# Patient Record
Sex: Female | Born: 2000 | Race: Black or African American | Hispanic: No | Marital: Single | Smoking: Current every day smoker
Health system: Southern US, Community
[De-identification: ages and names within clinical notes are randomized; demographics above are authoritative.]

## PROBLEM LIST (undated history)

## (undated) ENCOUNTER — Inpatient Hospital Stay (HOSPITAL_COMMUNITY): Payer: Self-pay

## (undated) ENCOUNTER — Ambulatory Visit (HOSPITAL_COMMUNITY): Payer: MEDICAID | Source: Home / Self Care

## (undated) DIAGNOSIS — Z789 Other specified health status: Secondary | ICD-10-CM

## (undated) DIAGNOSIS — Z3490 Encounter for supervision of normal pregnancy, unspecified, unspecified trimester: Secondary | ICD-10-CM

## (undated) DIAGNOSIS — D573 Sickle-cell trait: Secondary | ICD-10-CM

## (undated) DIAGNOSIS — L0291 Cutaneous abscess, unspecified: Secondary | ICD-10-CM

## (undated) DIAGNOSIS — L0591 Pilonidal cyst without abscess: Secondary | ICD-10-CM

## (undated) DIAGNOSIS — D649 Anemia, unspecified: Secondary | ICD-10-CM

## (undated) HISTORY — PX: NO PAST SURGERIES: SHX2092

---
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
## 2022-01-12 ENCOUNTER — Ambulatory Visit (HOSPITAL_COMMUNITY)
Admission: EM | Admit: 2022-01-12 | Discharge: 2022-01-12 | Disposition: A | Discharge status: Home / Self Care | Payer: Medicaid Other

## 2022-01-12 NOTE — ED Notes (Signed)
Patient was called in lobby and patient access called patient .  Patient answerred phone and was told room ready, but hung up ?

## 2022-01-12 NOTE — ED Notes (Signed)
Called patient letting her know room is ready. Pt stated she was on the way to [facility].  ?

## 2022-04-05 ENCOUNTER — Telehealth (HOSPITAL_COMMUNITY): Payer: Self-pay | Admitting: Physician Assistant

## 2022-04-05 ENCOUNTER — Encounter (HOSPITAL_COMMUNITY): Payer: Self-pay

## 2022-04-05 ENCOUNTER — Ambulatory Visit (HOSPITAL_COMMUNITY)
Admission: EM | Admit: 2022-04-05 | Discharge: 2022-04-05 | Disposition: A | Discharge status: Home / Self Care | Payer: Medicaid Other | Attending: Physician Assistant | Admitting: Physician Assistant

## 2022-04-05 DIAGNOSIS — R051 Acute cough: Secondary | ICD-10-CM

## 2022-04-05 DIAGNOSIS — J069 Acute upper respiratory infection, unspecified: Secondary | ICD-10-CM | POA: Diagnosis not present

## 2022-04-05 LAB — POCT RAPID STREP A, ED / UC: Streptococcus, Group A Screen (Direct): NEGATIVE

## 2022-04-05 MED ORDER — PROMETHAZINE-DM 6.25-15 MG/5ML PO SYRP: 5.0000 mL | ORAL_SOLUTION | Freq: Four times a day (QID) | ORAL | 0 refills | Status: DC | PRN

## 2022-04-05 NOTE — ED Triage Notes (Signed)
Patient has had cough and fatigue for 4 days. Patient also having nasal congestion. Productive cough with dark brown and sometimes yellow mucus.   Onset of emesis last night. States it is random, states she had a coughing fit that led to her throwing up.   Patient was around someone sick but that person did not get diagnosed with anything.   Patient last took OTC dayquil yesterday.

## 2022-04-05 NOTE — Discharge Instructions (Signed)
Use cough syrup as needed for cough Recommend Flonase and Mucinex Drink plenty of fluids, rest.  Return if you develop worsening symptoms.

## 2022-04-05 NOTE — ED Provider Notes (Signed)
[facility]    CSN: [id number] Arrival date & time: 04/05/22  1457      History   Chief Complaint Chief Complaint  Patient presents with   Cough   Emesis    HPI Haley [name] is a 21 y.o. female.   Pt complains of cough, fatigue, and congestion that started about four days ago.  Denies fever, chills, abdominal pain, shortness of breath, wheezing.  Denies h/o asthma.  She reports postnasal drip and sore throat.  Pt experienced vomiting yesterday after an episode of coughing.  Denies nausea.  She has taken Dayquil with temporary relief.  Denies sick contacts.     Past Medical History:  Diagnosis Date   Anemia    History of chlamydia 08/16/2016   Azithromycin Rx given 11/13   Pilonidal cyst    w/ hx recurrent abscess's   Sickle cell trait [facility])     Patient Active Problem List   Diagnosis Date Noted   Encounter for counseling regarding contraception 12/10/2020   Sickle cell trait (HCC) 06/25/2016    Past Surgical History:  Procedure Laterality Date   EXCISION MASS NECK  2010   per pt benign   PILONIDAL CYST EXCISION N/A 06/03/2021   Procedure: TREPHINATION  OF PILONIDAL CYST;  Surgeon: Berna Bue, MD;  Location: [facility];  Service: General;  Laterality: N/A;    OB History     Gravida  1   Para  1   Term  1   Preterm      AB      Living  1      SAB      IAB      Ectopic      Multiple  0   Live Births  1            Home Medications    Prior to Admission medications   Medication Sig Start Date End Date Taking? Authorizing Provider  levonorgestrel (MIRENA) 20 MCG/DAY IUD 1 each by Intrauterine route once.   Yes [provider]  promethazine-dextromethorphan (PROMETHAZINE-DM) 6.25-15 MG/5ML syrup Take 5 mLs by mouth 4 (four) times daily as needed for cough. 04/05/22  Yes Ward, Tylene Fantasia, PA-C  amoxicillin-clavulanate (AUGMENTIN) 875-125 MG tablet Take 1 tablet by mouth every 12 (twelve) hours.  10/29/21   Merrilee Jansky, MD  ibuprofen (ADVIL) 600 MG tablet Take 1 tablet (600 mg total) by mouth every 6 (six) hours as needed. 10/29/21   Lamptey, Britta Mccreedy, MD    Family History Family History  Problem Relation Age of Onset   Healthy Mother     Social History Social History   Tobacco Use   Smoking status: Every Day    Types: Cigars   Smokeless tobacco: Never  Vaping Use   Vaping Use: Every day   Devices: elfbar  Substance Use Topics   Alcohol use: No   Drug use: No     Allergies   Patient has no known allergies.   Review of Systems Review of Systems  Constitutional:  Positive for fatigue. Negative for chills and fever.  HENT:  Positive for congestion, postnasal drip and sore throat. Negative for ear pain.   Eyes:  Negative for pain and visual disturbance.  Respiratory:  Positive for cough. Negative for shortness of breath.   Cardiovascular:  Negative for chest pain and palpitations.  Gastrointestinal:  Positive for vomiting. Negative for abdominal pain, constipation, diarrhea and nausea.  Genitourinary:  Negative for  dysuria and hematuria.  Musculoskeletal:  Negative for arthralgias and back pain.  Skin:  Negative for color change and rash.  Neurological:  Negative for seizures and syncope.  All other systems reviewed and are negative.    Physical Exam Triage Vital Signs ED Triage Vitals  Enc Vitals Group     BP 04/05/22 1642 111/74     Pulse Rate 04/05/22 1642 (!) 101     Resp 04/05/22 1642 16     Temp 04/05/22 1642 98.7 F (37.1 C)     Temp Source 04/05/22 1642 Oral     SpO2 04/05/22 1642 95 %     Weight --      Height 04/05/22 1644 5\' 3"  (1.6 m)     Head Circumference --      Peak Flow --      Pain Score 04/05/22 1643 7     Pain Loc --      Pain Edu? --      Excl. in [place]? --    No data found.  Updated Vital Signs BP 111/74 (BP Location: Right Arm)   Pulse (!) 101   Temp 98.7 F (37.1 C) (Oral)   Resp 16   Ht 5\' 3"  (1.6 m)   LMP  (LMP  Unknown)   SpO2 95%   BMI 37.38 kg/m   Visual Acuity Right Eye Distance:   Left Eye Distance:   Bilateral Distance:    Right Eye Near:   Left Eye Near:    Bilateral Near:     Physical Exam Vitals and nursing note reviewed.  Constitutional:      General: She is not in acute distress.    Appearance: She is well-developed.  HENT:     Head: Normocephalic and atraumatic.  Eyes:     Conjunctiva/sclera: Conjunctivae normal.  Cardiovascular:     Rate and Rhythm: Normal rate and regular rhythm.     Heart sounds: No murmur heard. Pulmonary:     Effort: Pulmonary effort is normal. No respiratory distress.     Breath sounds: Normal breath sounds.  Abdominal:     Palpations: Abdomen is soft.     Tenderness: There is no abdominal tenderness.  Musculoskeletal:        General: No swelling.     Cervical back: Neck supple.  Skin:    General: Skin is warm and dry.     Capillary Refill: Capillary refill takes less than 2 seconds.  Neurological:     Mental Status: She is alert.  Psychiatric:        Mood and Affect: Mood normal.      UC Treatments / Results  Labs (all labs ordered are listed, but only abnormal results are displayed) Labs Reviewed  POCT RAPID STREP A, ED / UC    EKG   Radiology No results found.  Procedures Procedures (including critical care time)  Medications Ordered in UC Medications - No data to display  Initial Impression / Assessment and Plan / UC Course  I have reviewed the triage vital signs and the nursing notes.  Pertinent labs & imaging results that were available during my care of the patient were reviewed by me and considered in my medical decision making (see chart for details).     Viral URI. Pt overall well appearing in no acute distress, vitals normal, lungs clear.  Advised supportive care.  Described posttussive emesis, no nausea, abdominal pain, diarrhea.  Return precautions discussed.   Final Clinical Impressions(s) /  UC Diagnoses    Final diagnoses:  Viral upper respiratory tract infection  Acute cough     Discharge Instructions      Use cough syrup as needed for cough Recommend Flonase and Mucinex Drink plenty of fluids, rest.  Return if you develop worsening symptoms.    ED Prescriptions     Medication Sig Dispense Auth. Provider   promethazine-dextromethorphan (PROMETHAZINE-DM) 6.25-15 MG/5ML syrup Take 5 mLs by mouth 4 (four) times daily as needed for cough. 118 mL Ward, Tylene Fantasia, PA-C      PDMP not reviewed this encounter.   Ward, Tylene Fantasia, PA-C 04/05/22 [phone]

## 2022-04-05 NOTE — Telephone Encounter (Signed)
Pt called and asked for prescription to be sent to a different pharmacy.

## 2022-06-04 ENCOUNTER — Ambulatory Visit (HOSPITAL_COMMUNITY)
Admission: EM | Admit: 2022-06-04 | Discharge: 2022-06-04 | Discharge status: Against Medical Advice | Payer: Medicaid Other

## 2022-06-04 NOTE — ED Notes (Signed)
Pt was called to be placed in a room, when called the second time Pt stated they went home to get food. Educated on check in process and advised they would have to check back in to be seen. Verbalized understanding.

## 2022-07-08 IMAGING — DX DG CHEST 2V
2 series · 2 of 2 positions shown · non-contrast
Comparison: None.

CLINICAL DATA: cough

EXAM:
CHEST - 2 VIEW

[chest pa]
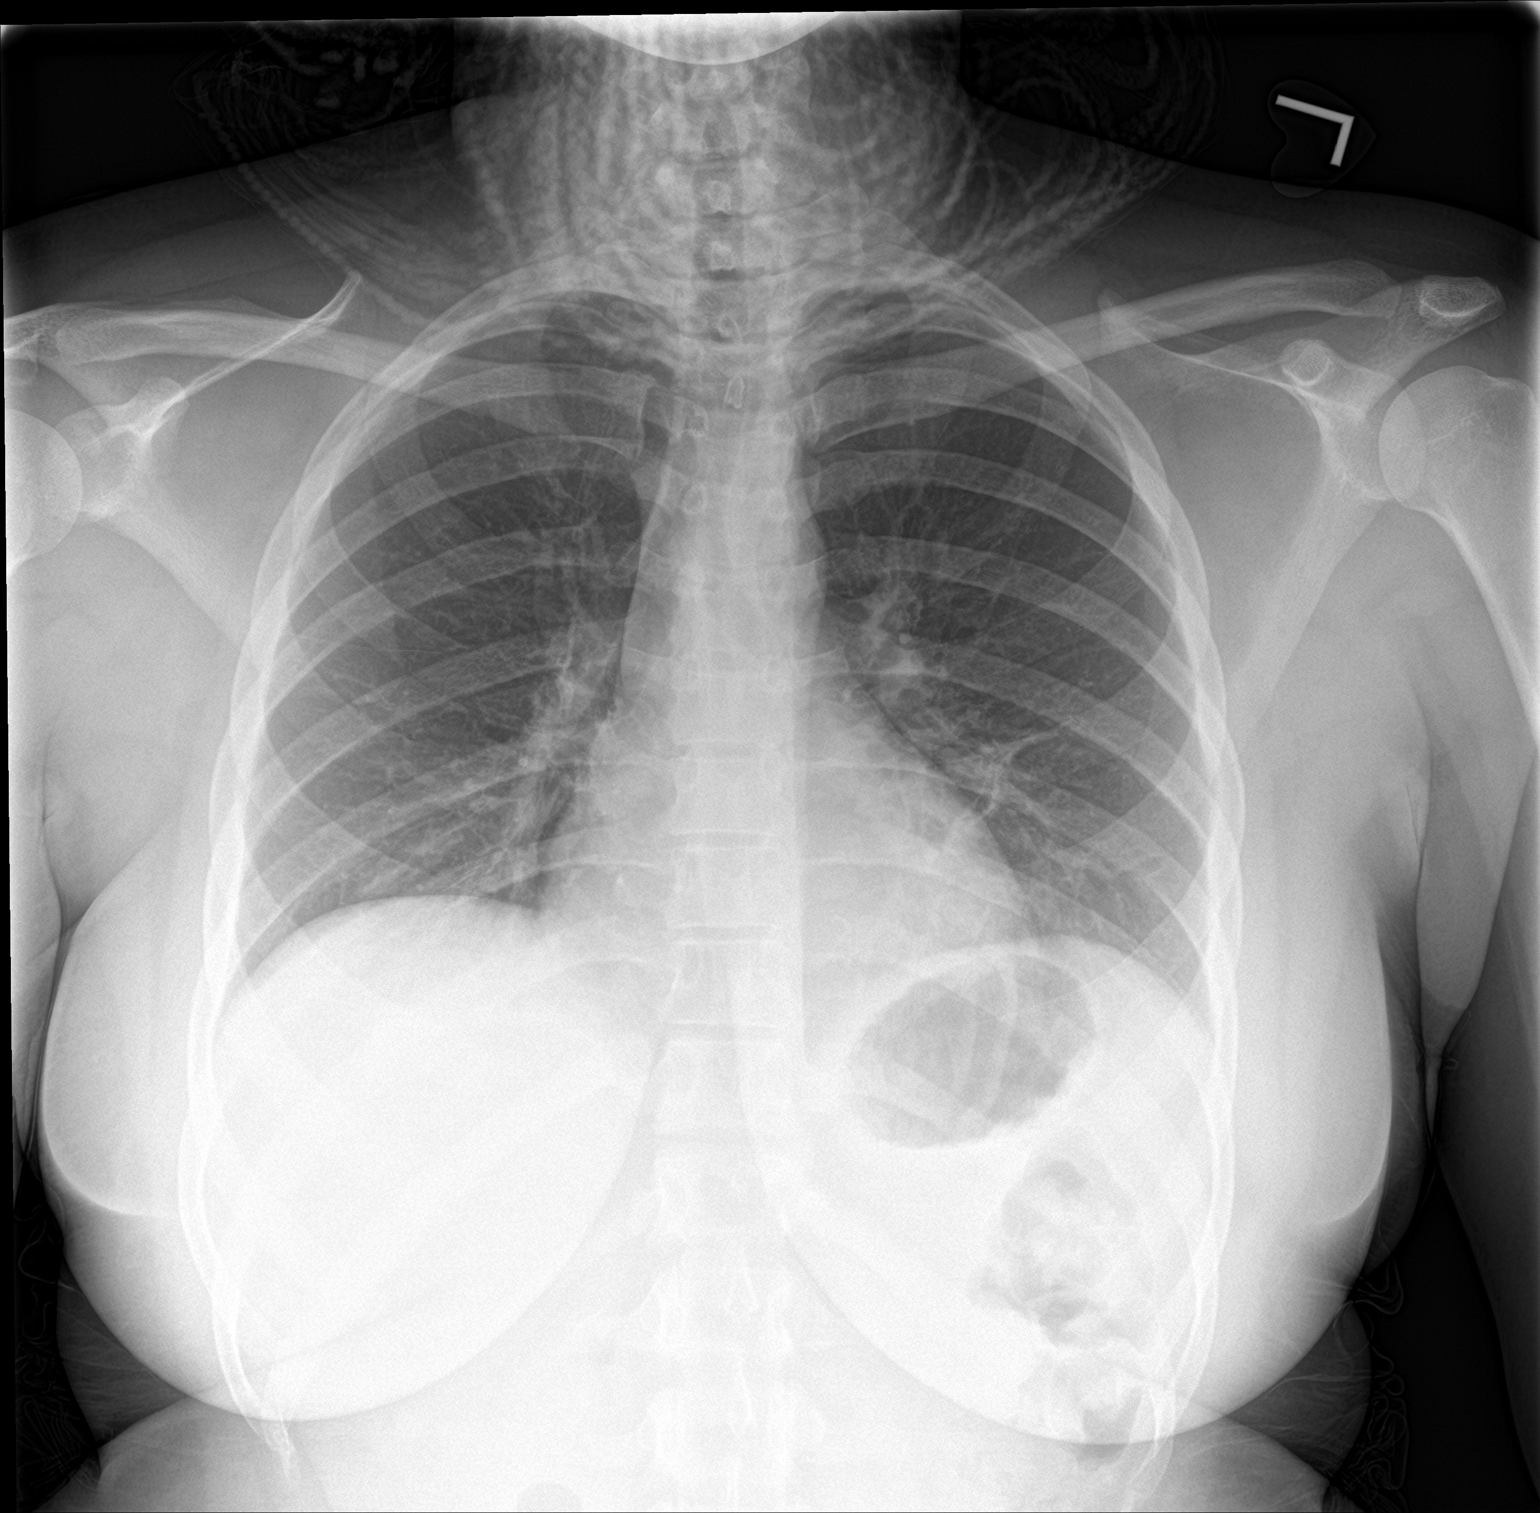

[chest lat]
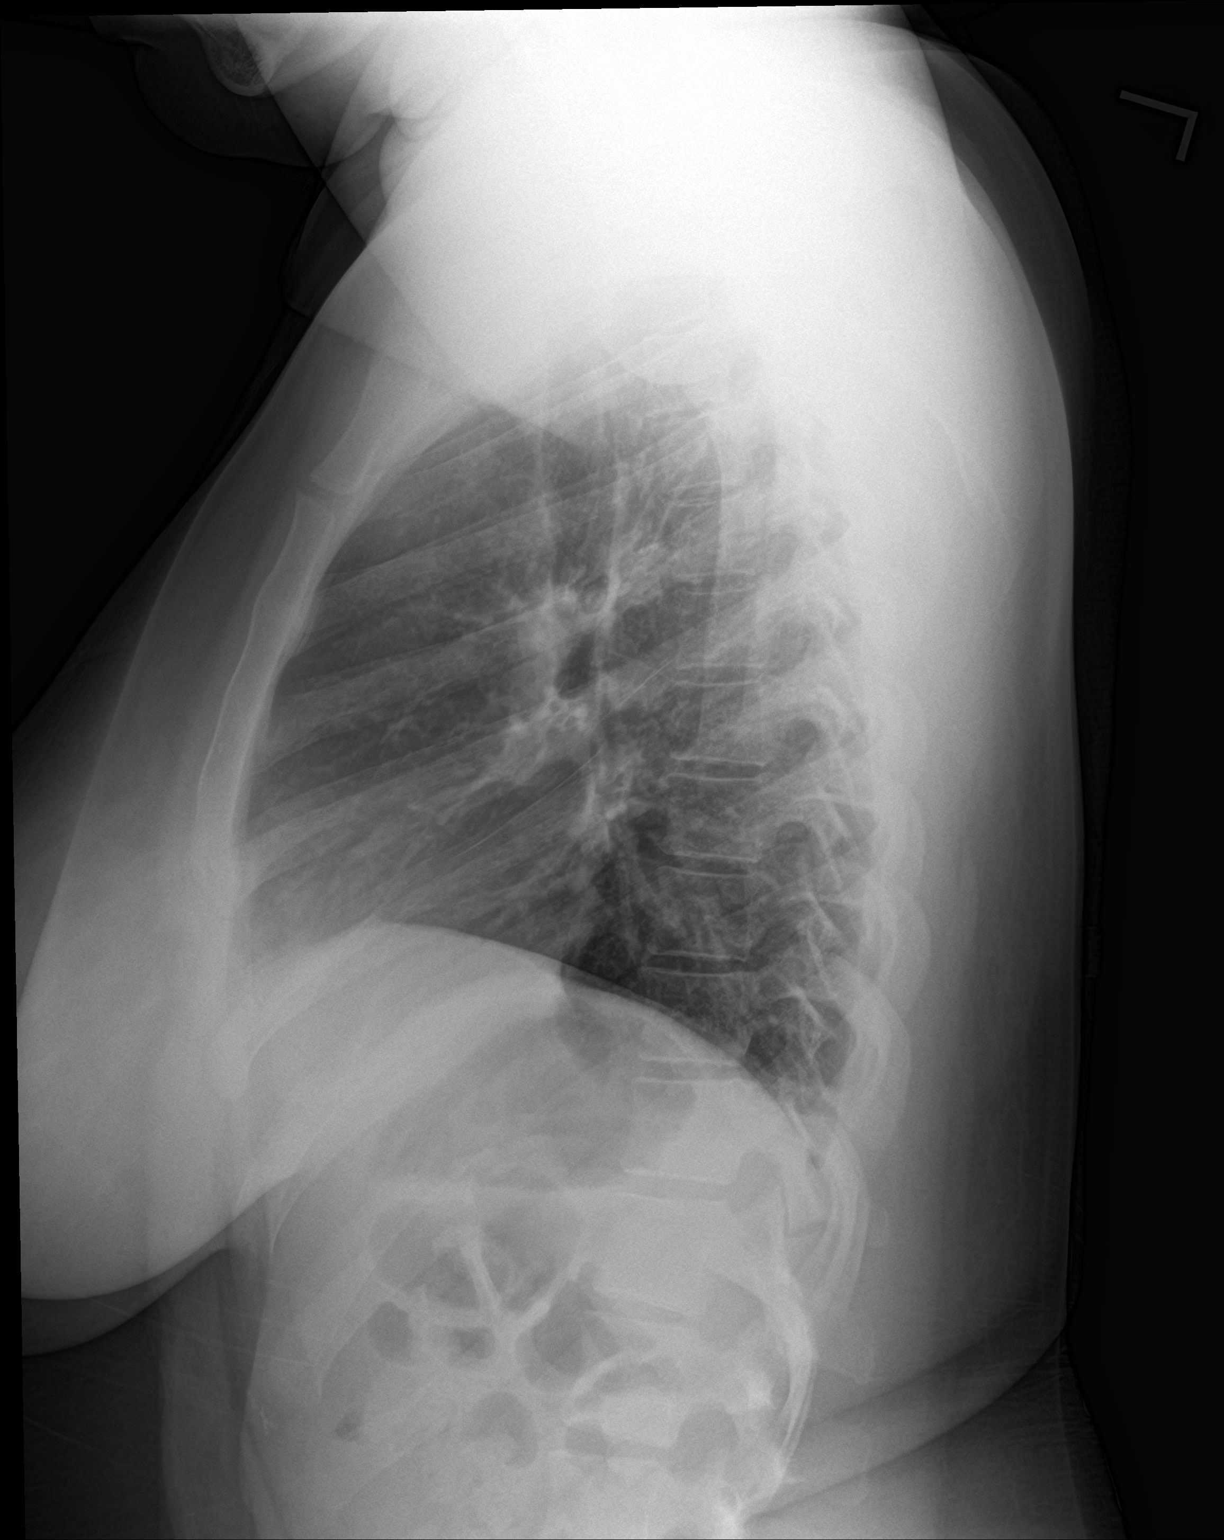

[2 of 2 positions shown; findings below may reference images not displayed]

FINDINGS: The cardiomediastinal silhouette is within normal limits. No pleural
effusion. No pneumothorax. No mass or consolidation. No acute
osseous abnormality.
IMPRESSION: No acute abnormality in the chest.  No consolidative pneumonia.

## 2022-10-07 ENCOUNTER — Emergency Department (HOSPITAL_COMMUNITY)
Admission: EM | Admit: 2022-10-07 | Discharge: 2022-10-08 | Disposition: A | Discharge status: Home / Self Care | Payer: Medicaid Other | Attending: Emergency Medicine | Admitting: Emergency Medicine

## 2022-10-07 ENCOUNTER — Other Ambulatory Visit: Payer: Self-pay

## 2022-10-07 DIAGNOSIS — A599 Trichomoniasis, unspecified: Secondary | ICD-10-CM | POA: Diagnosis not present

## 2022-10-07 DIAGNOSIS — N9489 Other specified conditions associated with female genital organs and menstrual cycle: Secondary | ICD-10-CM | POA: Insufficient documentation

## 2022-10-07 DIAGNOSIS — R197 Diarrhea, unspecified: Secondary | ICD-10-CM | POA: Insufficient documentation

## 2022-10-07 DIAGNOSIS — N898 Other specified noninflammatory disorders of vagina: Secondary | ICD-10-CM | POA: Diagnosis present

## 2022-10-07 NOTE — ED Provider Triage Note (Signed)
Emergency Medicine Provider Triage Evaluation Note  Haley [name] , a 22 y.o. female  was evaluated in triage.  Pt with multiple complaints.  States she feels like she has a stomach bug-- "bubbly" stomach sensation, diarrhea, etc.  Denies sick contacts with similar.  Also requesting STD testing as she has pelvic pain and discharge.  Does have IUD in place.  Review of Systems  Positive: Diarrhea, pelvic pain, vaginal discharge Negative: fever  Physical Exam  BP 133/89 (BP Location: Right Arm)   Pulse 90   Temp 98.7 F (37.1 C) (Oral)   Resp (!) 28   SpO2 100%  Gen:   Awake, no distress   Resp:  Normal effort  MSK:   Moves extremities without difficulty  Other:    Medical Decision Making  Medically screening exam initiated at 11:41 PM.  Appropriate orders placed.  [name] was informed that the remainder of the evaluation will be completed by another provider, this initial triage assessment does not replace that evaluation, and the importance of remaining in the ED until their evaluation is complete.  Multiple complaints-- Abdominal discomfort, diarrhea, vaginal discharge, pelvic pain.  Labs ordered, will need pelvic exam.   [name], PA-C 10/07/22 2344

## 2022-10-07 NOTE — ED Triage Notes (Signed)
Patient requesting STD test reports vaginal discharge with abdominal discomfort and diarrhea this week .

## 2022-10-08 LAB — COMPREHENSIVE METABOLIC PANEL
ALT: 10 U/L (ref 0–44)
AST: 16 U/L (ref 15–41)
Albumin: 3.8 g/dL (ref 3.5–5.0)
Alkaline Phosphatase: 74 U/L (ref 38–126)
Anion gap: 12 (ref 5–15)
BUN: 7 mg/dL (ref 6–20)
CO2: 20 mmol/L — ABNORMAL LOW (ref 22–32)
Calcium: 9.5 mg/dL (ref 8.9–10.3)
Chloride: 105 mmol/L (ref 98–111)
Creatinine, Ser: 0.71 mg/dL (ref 0.44–1.00)
GFR, Estimated: >60 mL/min (ref >60)
Glucose, Bld: 91 mg/dL (ref 70–99)
Potassium: 3.7 mmol/L (ref 3.5–5.1)
Sodium: 137 mmol/L (ref 135–145)
Total Bilirubin: 0.5 mg/dL (ref 0.3–1.2)
Total Protein: 7.5 g/dL (ref 6.5–8.1)

## 2022-10-08 LAB — URINALYSIS, ROUTINE W REFLEX MICROSCOPIC
Bilirubin Urine: NEGATIVE
Glucose, UA: NEGATIVE mg/dL
Hgb urine dipstick: NEGATIVE
Ketones, ur: 20 mg/dL — AB
Nitrite: NEGATIVE
Protein, ur: 30 mg/dL — AB
Specific Gravity, Urine: 1.032 — ABNORMAL HIGH (ref 1.005–1.030)
pH: 5 (ref 5.0–8.0)

## 2022-10-08 LAB — CBC WITH DIFFERENTIAL/PLATELET
Abs Immature Granulocytes: 0.03 10*3/uL (ref 0.00–0.07)
Basophils Absolute: 0 10*3/uL (ref 0.0–0.1)
Basophils Relative: 0 %
Eosinophils Absolute: 0.2 10*3/uL (ref 0.0–0.5)
Eosinophils Relative: 2 %
HCT: 35.1 % — ABNORMAL LOW (ref 36.0–46.0)
Hemoglobin: 12 g/dL (ref 12.0–15.0)
Immature Granulocytes: 0 %
Lymphocytes Relative: 23 %
Lymphs Abs: 2.3 10*3/uL (ref 0.7–4.0)
MCH: 25.9 pg — ABNORMAL LOW (ref 26.0–34.0)
MCHC: 34.2 g/dL (ref 30.0–36.0)
MCV: 75.6 fL — ABNORMAL LOW (ref 80.0–100.0)
Monocytes Absolute: 0.9 10*3/uL (ref 0.1–1.0)
Monocytes Relative: 9 %
Neutro Abs: 6.5 10*3/uL (ref 1.7–7.7)
Neutrophils Relative %: 66 %
Platelets: 379 10*3/uL (ref 150–400)
RBC: 4.64 MIL/uL (ref 3.87–5.11)
RDW: 17.4 % — ABNORMAL HIGH (ref 11.5–15.5)
WBC: 10 10*3/uL (ref 4.0–10.5)
nRBC: 0 % (ref 0.0–0.2)

## 2022-10-08 LAB — WET PREP, GENITAL
Sperm: NONE SEEN
WBC, Wet Prep HPF POC: >=10 — AB (ref <10)
Yeast Wet Prep HPF POC: NONE SEEN

## 2022-10-08 LAB — I-STAT BETA HCG BLOOD, ED (MC, WL, AP ONLY): I-stat hCG, quantitative: <5 m[IU]/mL (ref <5)

## 2022-10-08 LAB — LIPASE, BLOOD: Lipase: 27 U/L (ref 11–51)

## 2022-10-08 MED ORDER — LIDOCAINE HCL (PF) 1 % IJ SOLN
1.0000 mL | Freq: Once | INTRAMUSCULAR | Status: AC
  Administered 2022-10-08: 1 mL
  Filled 2022-10-08: qty 5

## 2022-10-08 MED ORDER — LOPERAMIDE HCL 2 MG PO CAPS: 2.0000 mg | ORAL_CAPSULE | Freq: Four times a day (QID) | ORAL | 0 refills | Status: DC | PRN

## 2022-10-08 MED ORDER — CEFTRIAXONE SODIUM 500 MG IJ SOLR
500.0000 mg | Freq: Once | INTRAMUSCULAR | Status: AC
  Administered 2022-10-08: 500 mg via INTRAMUSCULAR
  Filled 2022-10-08: qty 500

## 2022-10-08 MED ORDER — DOXYCYCLINE HYCLATE 100 MG PO TABS: 100.0000 mg | ORAL_TABLET | Freq: Two times a day (BID) | ORAL | 0 refills | Status: AC

## 2022-10-08 MED ORDER — METRONIDAZOLE 500 MG PO TABS
2000.0000 mg | ORAL_TABLET | Freq: Once | ORAL | Status: AC
  Administered 2022-10-08: 2000 mg via ORAL
  Filled 2022-10-08: qty 4

## 2022-10-08 NOTE — ED Notes (Signed)
Discharge instructions reviewed with patient. Patient denies any questions or concerns. Patient ambulatory out of ED. 

## 2022-10-08 NOTE — Discharge Instructions (Addendum)
Take the medication to help with your diarrhea.  Complete the course of antibiotics to help treat any potential infections causing your vaginal discharge.  Follow-up with a gynecologist to be rechecked.  Notify your sexual partners that they should go to the health department or their primary doctors to receive treatment for trichomonas

## 2022-10-08 NOTE — ED Provider Notes (Signed)
[facility] EMERGENCY DEPARTMENT Provider Note   CSN: [id number] Arrival date & time: 10/07/22  2320     History  Chief Complaint  Patient presents with   STD screening / Vaginal discharge    Haley [name] is a 22 y.o. female.  HPI   Patient has a history of an IUD.  She also has history of anemia chlamydia and sickle cell trait.  She comes to the ED with complaints of having some intermittent vaginal discharge.  Patient states sometimes it is.  Symptoms is gone.  She has had some mild lower abdominal discomfort.  Patient is concerned there is something wrong with her IUD.  She is not having any severe pain.  No fevers.  Patient also has had episodes of diarrhea over the last several days.  She has tried some over-the-counter medications.  Home Medications Prior to Admission medications   Medication Sig Start Date End Date Taking? Authorizing Provider  doxycycline (VIBRA-TABS) 100 MG tablet Take 1 tablet (100 mg total) by mouth 2 (two) times daily for 7 days. 10/08/22 10/15/22 Yes Dorie Rank, MD  loperamide (IMODIUM) 2 MG capsule Take 1 capsule (2 mg total) by mouth 4 (four) times daily as needed for diarrhea or loose stools. 10/08/22  Yes Dorie Rank, MD  amoxicillin-clavulanate (AUGMENTIN) 875-125 MG tablet Take 1 tablet by mouth every 12 (twelve) hours. 10/29/21   Chase Picket, MD  ibuprofen (ADVIL) 600 MG tablet Take 1 tablet (600 mg total) by mouth every 6 (six) hours as needed. 10/29/21   Chase Picket, MD  levonorgestrel (MIRENA) 20 MCG/DAY IUD 1 each by Intrauterine route once.    [provider]  promethazine-dextromethorphan (PROMETHAZINE-DM) 6.25-15 MG/5ML syrup Take 5 mLs by mouth 4 (four) times daily as needed for cough. 04/05/22   Ward, Lenise Arena, PA-C      Allergies    Patient has no known allergies.    Review of Systems   Review of Systems  Physical Exam Updated Vital Signs BP 130/82   Pulse (!) 111   Temp 98.2 F (36.8 C)   Resp  18   SpO2 100%  Physical Exam Vitals and nursing note reviewed. Exam conducted with a chaperone present.  Constitutional:      General: She is not in acute distress.    Appearance: She is well-developed.  HENT:     Head: Normocephalic and atraumatic.     Right Ear: External ear normal.     Left Ear: External ear normal.  Eyes:     General: No scleral icterus.       Right eye: No discharge.        Left eye: No discharge.     Conjunctiva/sclera: Conjunctivae normal.  Neck:     Trachea: No tracheal deviation.  Cardiovascular:     Rate and Rhythm: Normal rate and regular rhythm.  Pulmonary:     Effort: Pulmonary effort is normal. No respiratory distress.     Breath sounds: Normal breath sounds. No stridor. No wheezing or rales.  Abdominal:     General: Bowel sounds are normal. There is no distension.     Palpations: Abdomen is soft.     Tenderness: There is no abdominal tenderness. There is no guarding or rebound.  Genitourinary:    Pubic Area: No rash.      Labia:        Right: No rash or lesion.      Vagina: Vaginal discharge present. No tenderness.  Cervix: Discharge present. No cervical motion tenderness, friability or erythema.     Comments: White discharge vaginal vault, limited view of cervix during exam Musculoskeletal:        General: No tenderness or deformity.     Cervical back: Neck supple.  Skin:    General: Skin is warm and dry.     Findings: No rash.  Neurological:     General: No focal deficit present.     Mental Status: She is alert.     Cranial Nerves: No cranial nerve deficit, dysarthria or facial asymmetry.     Sensory: No sensory deficit.     Motor: No abnormal muscle tone or seizure activity.     Coordination: Coordination normal.  Psychiatric:        Mood and Affect: Mood normal.     ED Results / Procedures / Treatments   Labs (all labs ordered are listed, but only abnormal results are displayed) Labs Reviewed  WET PREP, GENITAL -  Abnormal; Notable for the following components:      Result Value   Trich, Wet Prep PRESENT (*)    Clue Cells Wet Prep HPF POC PRESENT (*)    WBC, Wet Prep HPF POC >=10 (*)    All other components within normal limits  CBC WITH DIFFERENTIAL/PLATELET - Abnormal; Notable for the following components:   HCT 35.1 (*)    MCV 75.6 (*)    MCH 25.9 (*)    RDW 17.4 (*)    All other components within normal limits  COMPREHENSIVE METABOLIC PANEL - Abnormal; Notable for the following components:   CO2 20 (*)    All other components within normal limits  URINALYSIS, ROUTINE W REFLEX MICROSCOPIC - Abnormal; Notable for the following components:   APPearance HAZY (*)    Specific Gravity, Urine 1.032 (*)    Ketones, ur 20 (*)    Protein, ur 30 (*)    Leukocytes,Ua SMALL (*)    Bacteria, UA MANY (*)    All other components within normal limits  LIPASE, BLOOD  I-STAT BETA HCG BLOOD, ED ([facility], [facility], [facility] ONLY)  GC/CHLAMYDIA PROBE AMP ([place]) NOT AT [facility]    EKG None  Radiology No results found.  Procedures Procedures    Medications Ordered in ED Medications  cefTRIAXone (ROCEPHIN) injection 500 mg (has no administration in time range)  lidocaine (PF) (XYLOCAINE) 1 % injection 1-2.1 mL (1 mL Other Given 10/08/22 0850)    ED Course/ Medical Decision Making/ A&P Clinical Course as of 10/08/22 0851  Fri Oct 08, 2022  0844 Wet prep, genital(!) Wet prep positive for trichomonas and clue cells [JK]  0844 CBC with Differential(!) CBC and metabolic panel unremarkable [JK]    Clinical Course User Index [JK] Dorie Rank, MD                           Medical Decision Making Problems Addressed: Diarrhea of presumed infectious origin: acute illness or injury that poses a threat to life or bodily functions Trichomoniasis: acute illness or injury that poses a threat to life or bodily functions  Amount and/or Complexity of Data Reviewed Labs: ordered. Decision-making details documented in ED  Course.  Risk Prescription drug management.   Patient presented to the ED for evaluation of diarrhea as well as vaginal discharge.  Patient's abdominal exam is benign.  No signs of peritonitis, no guarding.  She had some mild discomfort in the suprapubic region.  Pelvic exam without signs of cervical motion tenderness or PID.  Patient did have white vaginal discharge on exam.  Her laboratory tests are positive for trichomonas.  Treated with Flagyl in the ED.  Will add on empiric treatment for gonorrhea and chlamydia.  GC chlamydia testing sent off.  Will have patient follow-up with OB/GYN.   Diarrhea likely viral in nature.  No bloody diarrhea.  Abdominal exam benign.  Will treat with supportive care and Imodium       Final Clinical Impression(s) / ED Diagnoses Final diagnoses:  Trichomoniasis  Diarrhea of presumed infectious origin    Rx / DC Orders ED Discharge Orders          Ordered    doxycycline (VIBRA-TABS) 100 MG tablet  2 times daily        10/08/22 0845    loperamide (IMODIUM) 2 MG capsule  4 times daily PRN        10/08/22 0848              Dorie Rank, MD 10/08/22 [phone]

## 2022-10-11 LAB — GC/CHLAMYDIA PROBE AMP (~~LOC~~) NOT AT ARMC
Chlamydia: NEGATIVE
Comment: NEGATIVE
Comment: NORMAL
Neisseria Gonorrhea: NEGATIVE

## 2022-10-19 IMAGING — CR DG CHEST 2V
2 series · 2 of 2 positions shown · non-contrast
Comparison: July 05, 2021

CLINICAL DATA: Abdominal cramping and vomiting.

EXAM:
CHEST - 2 VIEW

[chest pa]
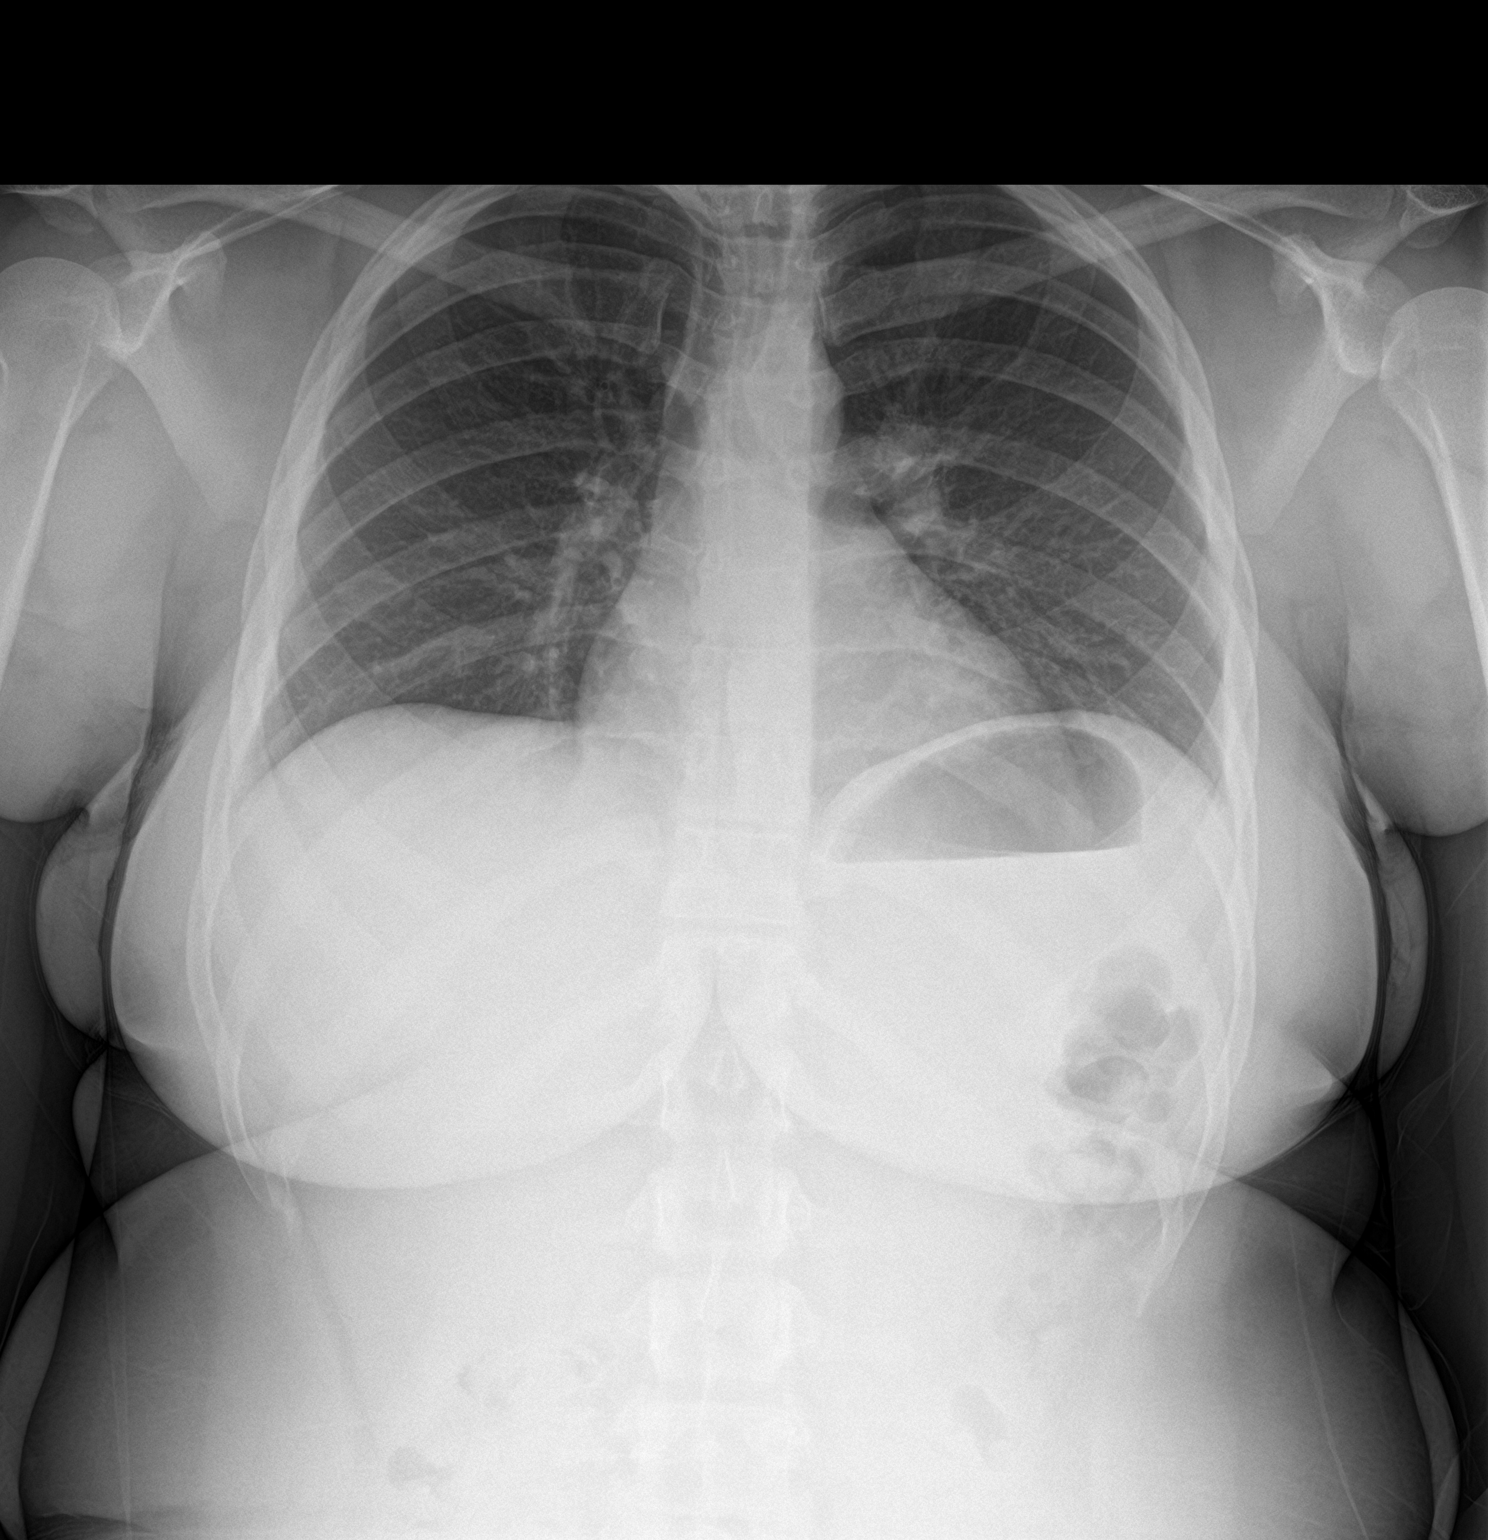

[chest lat]
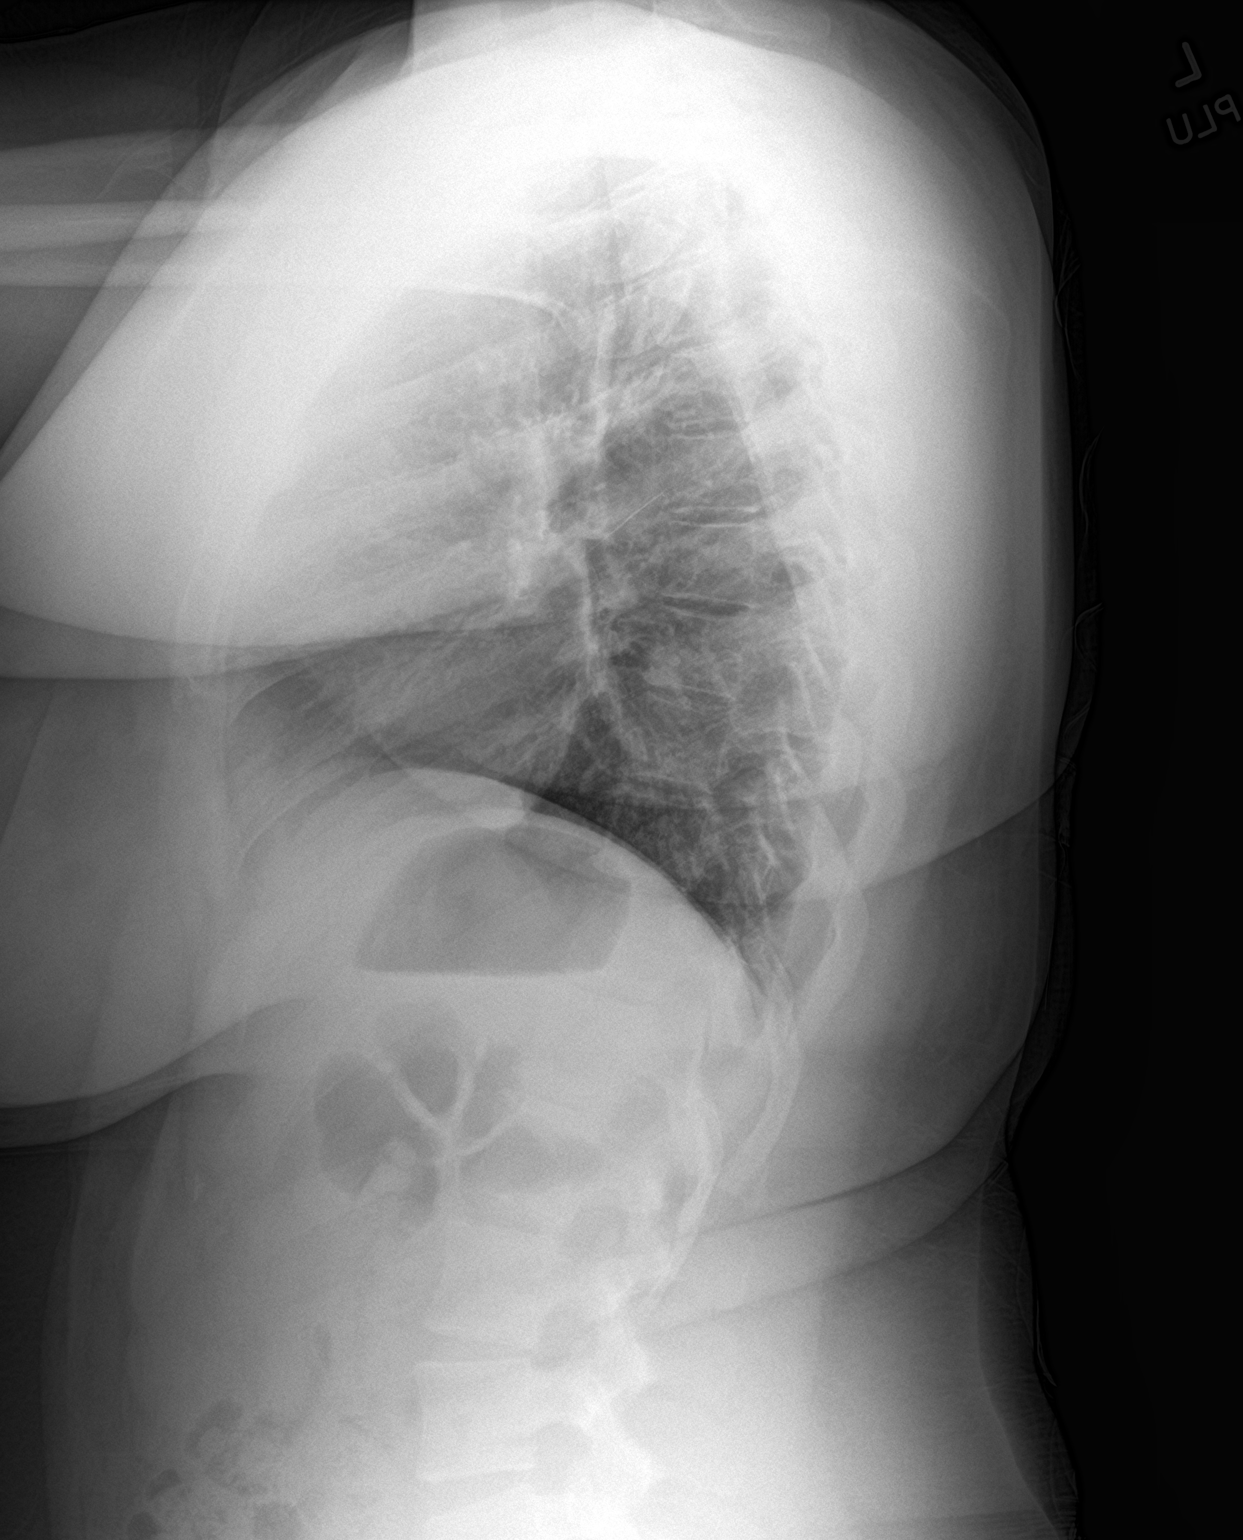

[2 of 2 positions shown; findings below may reference images not displayed]

FINDINGS: The heart size and mediastinal contours are within normal limits.
Both lungs are clear. The visualized skeletal structures are
unremarkable.
IMPRESSION: No active cardiopulmonary disease.

## 2022-11-04 ENCOUNTER — Encounter (INDEPENDENT_AMBULATORY_CARE_PROVIDER_SITE_OTHER): Payer: Self-pay

## 2022-11-29 ENCOUNTER — Ambulatory Visit (HOSPITAL_COMMUNITY)
Admission: EM | Admit: 2022-11-29 | Discharge: 2022-11-29 | Disposition: A | Discharge status: Home / Self Care | Payer: Medicaid Other | Attending: Emergency Medicine | Admitting: Emergency Medicine

## 2022-11-29 ENCOUNTER — Other Ambulatory Visit: Payer: Self-pay

## 2022-11-29 ENCOUNTER — Encounter (HOSPITAL_COMMUNITY): Payer: Self-pay | Admitting: *Deleted

## 2022-11-29 DIAGNOSIS — L0501 Pilonidal cyst with abscess: Secondary | ICD-10-CM

## 2022-11-29 MED ORDER — AMOXICILLIN-POT CLAVULANATE 875-125 MG PO TABS: 1.0000 | ORAL_TABLET | Freq: Two times a day (BID) | ORAL | 0 refills | Status: DC

## 2022-11-29 NOTE — Discharge Instructions (Addendum)
Keep area clean and covered until it heals. Talk with medicaid about where you can go for surgical follow up of your abscess if [facility] won't see you.

## 2022-11-29 NOTE — ED Triage Notes (Signed)
Pt reports an abscess between butt cheeks.

## 2022-11-29 NOTE — ED Provider Notes (Signed)
[facility]    CSN: [id number] Arrival date & time: 11/29/22  1358      History   Chief Complaint Chief Complaint  Patient presents with   Abscess    HPI Haley [name] is a 22 y.o. female. She has a history of pilonidal cyst.  She had it surgically removed a couple of years ago but since then abscess has recurred several times.  Has not open sore that is draining in that area at this time. Has not been back to see surgeon because she says they won't take her medicaid anymore.    Abscess   Past Medical History:  Diagnosis Date   Anemia    History of chlamydia 08/16/2016   Azithromycin Rx given 11/13   Pilonidal cyst    w/ hx recurrent abscess's   Sickle cell trait [facility])     Patient Active Problem List   Diagnosis Date Noted   Encounter for counseling regarding contraception 12/10/2020   Sickle cell trait ([place]) 06/25/2016    Past Surgical History:  Procedure Laterality Date   EXCISION MASS NECK  2010   per pt benign   PILONIDAL CYST EXCISION N/A 06/03/2021   Procedure: TREPHINATION  OF PILONIDAL CYST;  Surgeon: Clovis Riley, MD;  Location: [place];  Service: General;  Laterality: N/A;    OB History     Gravida  1   Para  1   Term  1   Preterm      AB      Living  1      SAB      IAB      Ectopic      Multiple  0   Live Births  1            Home Medications    Prior to Admission medications   Medication Sig Start Date End Date Taking? Authorizing Provider  amoxicillin-clavulanate (AUGMENTIN) 875-125 MG tablet Take 1 tablet by mouth every 12 (twelve) hours. 11/29/22   Carvel Getting, NP  ibuprofen (ADVIL) 600 MG tablet Take 1 tablet (600 mg total) by mouth every 6 (six) hours as needed. 10/29/21   Chase Picket, MD  levonorgestrel (MIRENA) 20 MCG/DAY IUD 1 each by Intrauterine route once.    [provider]  loperamide (IMODIUM) 2 MG capsule Take 1 capsule (2 mg total) by mouth 4 (four)  times daily as needed for diarrhea or loose stools. 10/08/22   Dorie Rank, MD  promethazine-dextromethorphan (PROMETHAZINE-DM) 6.25-15 MG/5ML syrup Take 5 mLs by mouth 4 (four) times daily as needed for cough. 04/05/22   Ward, Lenise Arena, PA-C    Family History Family History  Problem Relation Age of Onset   Healthy Mother     Social History Social History   Tobacco Use   Smoking status: Every Day    Types: Cigars   Smokeless tobacco: Never  Vaping Use   Vaping Use: Every day   Devices: elfbar  Substance Use Topics   Alcohol use: No   Drug use: No     Allergies   Patient has no known allergies.   Review of Systems Review of Systems   Physical Exam Triage Vital Signs ED Triage Vitals  Enc Vitals Group     BP 11/29/22 1558 109/82     Pulse Rate 11/29/22 1558 (!) 104     Resp 11/29/22 1558 18     Temp 11/29/22 1558 98.2 F (36.8 C)  Temp src --      SpO2 11/29/22 1558 96 %     Weight --      Height --      Head Circumference --      Peak Flow --      Pain Score 11/29/22 1700 0     Pain Loc --      Pain Edu? --      Excl. in [place]? --    No data found.  Updated Vital Signs BP 109/82   Pulse (!) 104   Temp 98.2 F (36.8 C)   Resp 18   SpO2 96%   Visual Acuity Right Eye Distance:   Left Eye Distance:   Bilateral Distance:    Right Eye Near:   Left Eye Near:    Bilateral Near:     Physical Exam Constitutional:      Appearance: Normal appearance. She is obese.  Pulmonary:     Effort: Pulmonary effort is normal.  Skin:      Neurological:     Mental Status: She is alert.      UC Treatments / Results  Labs (all labs ordered are listed, but only abnormal results are displayed) Labs Reviewed - No data to display  EKG   Radiology No results found.  Procedures Procedures (including critical care time)  Medications Ordered in UC Medications - No data to display  Initial Impression / Assessment and Plan / UC Course  I have reviewed  the triage vital signs and the nursing notes.  Pertinent labs & imaging results that were available during my care of the patient were reviewed by me and considered in my medical decision making (see chart for details).    No area to I&D at this time - will rx antibiotics. As this keeps recurring for her, she will talk with Medicaid about surgeon offices.   Final Clinical Impressions(s) / UC Diagnoses   Final diagnoses:  Pilonidal abscess     Discharge Instructions      Keep area clean and covered until it heals. Talk with medicaid about where you can go for surgical follow up of your abscess if [facility] won't see you.    ED Prescriptions     Medication Sig Dispense Auth. Provider   amoxicillin-clavulanate (AUGMENTIN) 875-125 MG tablet Take 1 tablet by mouth every 12 (twelve) hours. 14 tablet Carvel Getting, NP      PDMP not reviewed this encounter.   Carvel Getting, NP 11/29/22 1710

## 2023-02-05 ENCOUNTER — Other Ambulatory Visit: Payer: Self-pay

## 2023-02-05 ENCOUNTER — Encounter (HOSPITAL_BASED_OUTPATIENT_CLINIC_OR_DEPARTMENT_OTHER): Payer: Self-pay | Admitting: Emergency Medicine

## 2023-02-05 DIAGNOSIS — S161XXA Strain of muscle, fascia and tendon at neck level, initial encounter: Secondary | ICD-10-CM | POA: Insufficient documentation

## 2023-02-05 DIAGNOSIS — F17290 Nicotine dependence, other tobacco product, uncomplicated: Secondary | ICD-10-CM | POA: Insufficient documentation

## 2023-02-05 DIAGNOSIS — S0993XA Unspecified injury of face, initial encounter: Secondary | ICD-10-CM | POA: Diagnosis not present

## 2023-02-05 DIAGNOSIS — S0003XA Contusion of scalp, initial encounter: Secondary | ICD-10-CM | POA: Insufficient documentation

## 2023-02-05 DIAGNOSIS — S199XXA Unspecified injury of neck, initial encounter: Secondary | ICD-10-CM | POA: Diagnosis present
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
## 2023-02-06 ENCOUNTER — Emergency Department (HOSPITAL_BASED_OUTPATIENT_CLINIC_OR_DEPARTMENT_OTHER): Payer: Medicaid Other

## 2023-02-06 ENCOUNTER — Emergency Department (HOSPITAL_BASED_OUTPATIENT_CLINIC_OR_DEPARTMENT_OTHER)
Admission: EM | Admit: 2023-02-06 | Discharge: 2023-02-06 | Disposition: A | Discharge status: Home / Self Care | Payer: Medicaid Other | Attending: Emergency Medicine | Admitting: Emergency Medicine

## 2023-02-06 DIAGNOSIS — S161XXA Strain of muscle, fascia and tendon at neck level, initial encounter: Secondary | ICD-10-CM

## 2023-02-06 DIAGNOSIS — S0003XA Contusion of scalp, initial encounter: Secondary | ICD-10-CM

## 2023-02-06 DIAGNOSIS — S0993XA Unspecified injury of face, initial encounter: Secondary | ICD-10-CM

## 2023-02-06 MED ORDER — CYCLOBENZAPRINE HCL 10 MG PO TABS: 10.0000 mg | ORAL_TABLET | Freq: Three times a day (TID) | ORAL | 0 refills | Status: DC | PRN

## 2023-02-06 MED ORDER — NAPROXEN 375 MG PO TABS: ORAL_TABLET | ORAL | 0 refills | Status: DC

## 2023-02-06 MED ORDER — NAPROXEN 250 MG PO TABS
500.0000 mg | ORAL_TABLET | Freq: Once | ORAL | Status: AC
  Administered 2023-02-06: 500 mg via ORAL
  Filled 2023-02-06: qty 2

## 2023-02-06 MED ORDER — CYCLOBENZAPRINE HCL 10 MG PO TABS
10.0000 mg | ORAL_TABLET | Freq: Once | ORAL | Status: AC
  Administered 2023-02-06: 10 mg via ORAL
  Filled 2023-02-06: qty 1
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
## 2023-02-18 ENCOUNTER — Encounter (HOSPITAL_BASED_OUTPATIENT_CLINIC_OR_DEPARTMENT_OTHER): Payer: Self-pay | Admitting: Emergency Medicine

## 2023-02-18 ENCOUNTER — Other Ambulatory Visit: Payer: Self-pay

## 2023-02-18 ENCOUNTER — Emergency Department (HOSPITAL_BASED_OUTPATIENT_CLINIC_OR_DEPARTMENT_OTHER)
Admission: EM | Admit: 2023-02-18 | Discharge: 2023-02-18 | Disposition: A | Discharge status: Home / Self Care | Payer: Medicaid Other | Attending: Emergency Medicine | Admitting: Emergency Medicine

## 2023-02-18 DIAGNOSIS — R112 Nausea with vomiting, unspecified: Secondary | ICD-10-CM | POA: Insufficient documentation

## 2023-02-18 DIAGNOSIS — Z1152 Encounter for screening for COVID-19: Secondary | ICD-10-CM | POA: Diagnosis not present

## 2023-02-18 DIAGNOSIS — J069 Acute upper respiratory infection, unspecified: Secondary | ICD-10-CM | POA: Diagnosis not present

## 2023-02-18 DIAGNOSIS — D72829 Elevated white blood cell count, unspecified: Secondary | ICD-10-CM | POA: Insufficient documentation

## 2023-02-18 DIAGNOSIS — R059 Cough, unspecified: Secondary | ICD-10-CM | POA: Diagnosis present

## 2023-02-18 LAB — COMPREHENSIVE METABOLIC PANEL
ALT: 11 U/L (ref 0–44)
AST: 18 U/L (ref 15–41)
Albumin: 4 g/dL (ref 3.5–5.0)
Alkaline Phosphatase: 67 U/L (ref 38–126)
Anion gap: 8 (ref 5–15)
BUN: 8 mg/dL (ref 6–20)
CO2: 24 mmol/L (ref 22–32)
Calcium: 8.9 mg/dL (ref 8.9–10.3)
Chloride: 106 mmol/L (ref 98–111)
Creatinine, Ser: 0.7 mg/dL (ref 0.44–1.00)
GFR, Estimated: >60 mL/min (ref >60)
Glucose, Bld: 76 mg/dL (ref 70–99)
Potassium: 3.4 mmol/L — ABNORMAL LOW (ref 3.5–5.1)
Sodium: 138 mmol/L (ref 135–145)
Total Bilirubin: 0.6 mg/dL (ref 0.3–1.2)
Total Protein: 6.9 g/dL (ref 6.5–8.1)

## 2023-02-18 LAB — GROUP A STREP BY PCR: Group A Strep by PCR: NOT DETECTED

## 2023-02-18 LAB — CBC WITH DIFFERENTIAL/PLATELET
Abs Immature Granulocytes: 0.03 10*3/uL (ref 0.00–0.07)
Basophils Absolute: 0 10*3/uL (ref 0.0–0.1)
Basophils Relative: 0 %
Eosinophils Absolute: 0.2 10*3/uL (ref 0.0–0.5)
Eosinophils Relative: 2 %
HCT: 31 % — ABNORMAL LOW (ref 36.0–46.0)
Hemoglobin: 10.7 g/dL — ABNORMAL LOW (ref 12.0–15.0)
Immature Granulocytes: 0 %
Lymphocytes Relative: 21 %
Lymphs Abs: 2.6 10*3/uL (ref 0.7–4.0)
MCH: 26.2 pg (ref 26.0–34.0)
MCHC: 34.5 g/dL (ref 30.0–36.0)
MCV: 76 fL — ABNORMAL LOW (ref 80.0–100.0)
Monocytes Absolute: 1 10*3/uL (ref 0.1–1.0)
Monocytes Relative: 8 %
Neutro Abs: 8.9 10*3/uL — ABNORMAL HIGH (ref 1.7–7.7)
Neutrophils Relative %: 69 %
Platelets: 343 10*3/uL (ref 150–400)
RBC: 4.08 MIL/uL (ref 3.87–5.11)
RDW: 16.4 % — ABNORMAL HIGH (ref 11.5–15.5)
WBC: 12.8 10*3/uL — ABNORMAL HIGH (ref 4.0–10.5)
nRBC: 0 % (ref 0.0–0.2)

## 2023-02-18 LAB — URINALYSIS, ROUTINE W REFLEX MICROSCOPIC
Bacteria, UA: NONE SEEN
Bilirubin Urine: NEGATIVE
Glucose, UA: NEGATIVE mg/dL
Hgb urine dipstick: NEGATIVE
Nitrite: NEGATIVE
Specific Gravity, Urine: 1.035 — ABNORMAL HIGH (ref 1.005–1.030)
pH: 6.5 (ref 5.0–8.0)

## 2023-02-18 LAB — RESP PANEL BY RT-PCR (RSV, FLU A&B, COVID)  RVPGX2
Influenza A by PCR: NEGATIVE
Influenza B by PCR: NEGATIVE
Resp Syncytial Virus by PCR: NEGATIVE
SARS Coronavirus 2 by RT PCR: NEGATIVE

## 2023-02-18 LAB — HCG, SERUM, QUALITATIVE: Preg, Serum: NEGATIVE

## 2023-02-18 LAB — LIPASE, BLOOD: Lipase: <10 U/L — ABNORMAL LOW (ref 11–51)

## 2023-02-18 MED ORDER — SODIUM CHLORIDE 0.9 % IV BOLUS
1000.0000 mL | Freq: Once | INTRAVENOUS | Status: AC
  Administered 2023-02-18: 1000 mL via INTRAVENOUS

## 2023-02-18 MED ORDER — FLUTICASONE PROPIONATE 50 MCG/ACT NA SUSP: 1.0000 | Freq: Every day | NASAL | 0 refills | Status: DC

## 2023-02-18 MED ORDER — BENZONATATE 100 MG PO CAPS: 100.0000 mg | ORAL_CAPSULE | Freq: Three times a day (TID) | ORAL | 0 refills | Status: DC

## 2023-02-18 MED ORDER — ONDANSETRON 4 MG PO TBDP: 4.0000 mg | ORAL_TABLET | Freq: Three times a day (TID) | ORAL | 0 refills | Status: DC | PRN

## 2023-02-18 MED ORDER — ONDANSETRON HCL 4 MG/2ML IJ SOLN
4.0000 mg | Freq: Once | INTRAMUSCULAR | Status: AC
  Administered 2023-02-18: 4 mg via INTRAVENOUS
  Filled 2023-02-18: qty 2

## 2023-02-18 NOTE — ED Notes (Signed)
Provided pt with apple juice and crackers for PO challenge. Pt also notes she regularly has low BP.

## 2023-02-18 NOTE — ED Notes (Signed)
Pt tolerated PO.

## 2023-02-18 NOTE — Discharge Instructions (Signed)
It was a pleasure taking care of you here in the emergency department  We have written for a few medications to help with your symptoms  Take as prescribed  Return for new or worsening symptoms

## 2023-02-18 NOTE — ED Provider Notes (Signed)
Haley [name] Provider Note   CSN: [id number] Arrival date & time: 02/18/23  1906    History  Chief Complaint  Patient presents with   Cough    Haley [name] is a 22 y.o. female no significant past medical history here for evaluation of feeling unwell.  Beginning the week developed congestion, rhinorrhea, cough and sore throat.  Daughter was sick her daughter improved and patient did not.  She is having multiple episodes of NBNB emesis and has not been able to keep down any p.o. intake over the last 24 hours.  Has a generalized abdominal pain however no focal pain.  No fever.  No dysuria or hematuria.  Denies chance of pregnancy.  No chest pain or shortness of breath.  Cough is nonproductive.  She has active emesis in the room on my evaluation. No diarrhea, change in BM, bloody stool.  HPI     Home Medications Prior to Admission medications   Medication Sig Start Date End Date Taking? Authorizing Provider  benzonatate (TESSALON) 100 MG capsule Take 1 capsule (100 mg total) by mouth every 8 (eight) hours. 02/18/23  Yes Haley [name] A, PA-C  fluticasone (FLONASE) 50 MCG/ACT nasal spray Place 1 spray into both nostrils daily. 02/18/23  Yes Haley [name] A, PA-C  ondansetron (ZOFRAN-ODT) 4 MG disintegrating tablet Take 1 tablet (4 mg total) by mouth every 8 (eight) hours as needed for nausea or vomiting. 02/18/23  Yes Haley [name] A, PA-C  cyclobenzaprine (FLEXERIL) 10 MG tablet Take 1 tablet (10 mg total) by mouth 3 (three) times daily as needed (for jaw spasms). 02/06/23   Molpus, John, MD  levonorgestrel (MIRENA) 20 MCG/DAY IUD 1 each by Intrauterine route once.    [provider]  naproxen (NAPROSYN) 375 MG tablet Take 1 tablet twice daily as needed for pain. 02/06/23   Molpus, Jonny Ruiz, MD      Allergies    Patient has no known allergies.    Review of Systems   Review of Systems  Constitutional:  Positive for activity change,  appetite change and fatigue.  HENT:  Positive for congestion, rhinorrhea and sore throat. Negative for trouble swallowing and voice change.   Respiratory:  Positive for cough. Negative for shortness of breath, wheezing and stridor.   Cardiovascular: Negative.   Gastrointestinal:  Positive for abdominal pain (generalized), nausea and vomiting. Negative for abdominal distention, anal bleeding, blood in stool, constipation, diarrhea and rectal pain.  Genitourinary: Negative.   Musculoskeletal: Negative.   Skin: Negative.   Neurological: Negative.   All other systems reviewed and are negative.   Physical Exam Updated Vital Signs BP (!) 100/47   Pulse 92   Temp 98.7 F (37.1 C) (Oral)   Resp 18   SpO2 96%  Physical Exam Vitals and nursing note reviewed.  Constitutional:      General: She is not in acute distress.    Appearance: She is well-developed. She is not ill-appearing, toxic-appearing or diaphoretic.  HENT:     Head: Normocephalic and atraumatic.     Nose: Congestion and rhinorrhea present.     Mouth/Throat:     Mouth: Mucous membranes are moist.     Comments: Posterior oropharynx erythematous, uvula midline, no evidence of PTA or RPA, no pooling of secretions, tonsils without edema, exudate Eyes:     Pupils: Pupils are equal, round, and reactive to light.  Neck:     Comments: Full range of motion Cardiovascular:  Rate and Rhythm: Normal rate.     Pulses: Normal pulses.     Heart sounds: Normal heart sounds.  Pulmonary:     Effort: Pulmonary effort is normal. No respiratory distress.     Breath sounds: Normal breath sounds.  Abdominal:     General: Bowel sounds are normal. There is no distension.     Palpations: Abdomen is soft.     Tenderness: There is no abdominal tenderness. There is no right CVA tenderness, left CVA tenderness, guarding or rebound.     Comments: Soft, nontender, active emesis in room  Musculoskeletal:        General: No swelling or tenderness.  Normal range of motion.     Cervical back: Normal range of motion and neck supple.     Right lower leg: No edema.     Left lower leg: No edema.  Skin:    General: Skin is warm and dry.     Capillary Refill: Capillary refill takes less than 2 seconds.  Neurological:     General: No focal deficit present.     Mental Status: She is alert.     Motor: No weakness.     Gait: Gait normal.  Psychiatric:        Mood and Affect: Mood normal.     ED Results / Procedures / Treatments   Labs (all labs ordered are listed, but only abnormal results are displayed) Labs Reviewed  CBC WITH DIFFERENTIAL/PLATELET - Abnormal; Notable for the following components:      Result Value   WBC 12.8 (*)    Hemoglobin 10.7 (*)    HCT 31.0 (*)    MCV 76.0 (*)    RDW 16.4 (*)    Neutro Abs 8.9 (*)    All other components within normal limits  COMPREHENSIVE METABOLIC PANEL - Abnormal; Notable for the following components:   Potassium 3.4 (*)    All other components within normal limits  LIPASE, BLOOD - Abnormal; Notable for the following components:   Lipase <10 (*)    All other components within normal limits  URINALYSIS, ROUTINE W REFLEX MICROSCOPIC - Abnormal; Notable for the following components:   Specific Gravity, Urine 1.035 (*)    Ketones, ur TRACE (*)    Protein, ur TRACE (*)    Leukocytes,Ua SMALL (*)    All other components within normal limits  RESP PANEL BY RT-PCR (RSV, FLU A&B, COVID)  RVPGX2  GROUP A STREP BY PCR  HCG, SERUM, QUALITATIVE    EKG None  Radiology No results found.  Procedures Procedures    Medications Ordered in ED Medications  ondansetron (ZOFRAN) injection 4 mg (4 mg Intravenous Given 02/18/23 1948)  sodium chloride 0.9 % bolus 1,000 mL (0 mLs Intravenous Stopped 02/18/23 2101)    ED Course/ Medical Decision Making/ A&P   22 year old here for evaluation of feeling unwell.  1 week of URI symptoms however last 24 hours develop NBNB emesis.  Unable to keep  down p.o. intake.  She denies chance of pregnancy.  She appears clinically mildly dehydrated.  Posterior pharynx has some mild erythema however no exudates, uvula midline, no evidence of PTA or RPA.  Her heart and lungs are clear.  Her abdomen is soft, nontender.  No UTI symptoms.  Will plan on check labs, symptomatic management and reassess  Labs personally viewed and interpreted:  Strep negative CBC leukocytosis 12.8, hemoglobin 10.7, similar to prior CMP potassium 3.4 Lipase <10 UA negative for infection Pregnancy  test neg COVID, flu, RSV negative  Patient reassessed.  Feels improved after IV fluids, Zofran, tolerating p.o. intake.  I suspect she likely has a viral URI.  Will treat as such.  Low suspicion for acute bacterial pathology requiring antibiotics, PTA, RPA, strep pharyngitis, pneumonia, intra-abdominal etiology, UTI, pregnancy.  DC home with symptomatic management.  The patient has been appropriately medically screened and/or stabilized in the ED. I have low suspicion for any other emergent medical condition which would require further screening, evaluation or treatment in the ED or require inpatient management.  Patient is hemodynamically stable and in no acute distress.  Patient able to ambulate in department prior to ED.  Evaluation does not show acute pathology that would require ongoing or additional emergent interventions while in the emergency department or further inpatient treatment.  I have discussed the diagnosis with the patient and answered all questions.  Pain is been managed while in the emergency department and patient has no further complaints prior to discharge.  Patient is comfortable with plan discussed in room and is stable for discharge at this time.  I have discussed strict return precautions for returning to the emergency department.  Patient was encouraged to follow-up with PCP/specialist refer to at discharge.                             Medical Decision  Making Amount and/or Complexity of Data Reviewed External Data Reviewed: labs and notes. Labs: ordered. Decision-making details documented in ED Course.  Risk OTC drugs. Prescription drug management. Parenteral controlled substances. Decision regarding hospitalization. Diagnosis or treatment significantly limited by social determinants of health.          Final Clinical Impression(s) / ED Diagnoses Final diagnoses:  Nausea and vomiting, unspecified vomiting type  Viral URI with cough    Rx / DC Orders ED Discharge Orders          Ordered    ondansetron (ZOFRAN-ODT) 4 MG disintegrating tablet  Every 8 hours PRN        02/18/23 2241    fluticasone (FLONASE) 50 MCG/ACT nasal spray  Daily        02/18/23 2241    benzonatate (TESSALON) 100 MG capsule  Every 8 hours        02/18/23 2241              Venera Privott A, PA-C 02/18/23 2243    Rondel Baton, MD 02/21/23 1526

## 2023-02-18 NOTE — ED Triage Notes (Addendum)
Pt presents to ED POV. Pt c/o nasal congestion, cough since the beginning of the week. Reports cold chills. Daughter sick. Also adds sore throat

## 2023-02-18 NOTE — ED Notes (Signed)
Reviewed AVS with patient, patient expressed understanding of directions, denies further questions at this time. 

## 2023-05-04 ENCOUNTER — Encounter (HOSPITAL_COMMUNITY): Payer: Self-pay

## 2023-05-04 ENCOUNTER — Ambulatory Visit (HOSPITAL_COMMUNITY)
Admission: EM | Admit: 2023-05-04 | Discharge: 2023-05-04 | Disposition: A | Discharge status: Home / Self Care | Payer: MEDICAID | Attending: Emergency Medicine | Admitting: Emergency Medicine

## 2023-05-04 DIAGNOSIS — L0231 Cutaneous abscess of buttock: Secondary | ICD-10-CM | POA: Diagnosis not present

## 2023-05-04 MED ORDER — IBUPROFEN 800 MG PO TABS: 800.0000 mg | ORAL_TABLET | Freq: Three times a day (TID) | ORAL | 0 refills | Status: DC

## 2023-05-04 MED ORDER — SULFAMETHOXAZOLE-TRIMETHOPRIM 800-160 MG PO TABS: 1.0000 | ORAL_TABLET | Freq: Two times a day (BID) | ORAL | 0 refills | Status: AC

## 2023-05-04 MED ORDER — KETOROLAC TROMETHAMINE 30 MG/ML IJ SOLN
30.0000 mg | Freq: Once | INTRAMUSCULAR | Status: AC
  Administered 2023-05-04: 30 mg via INTRAMUSCULAR

## 2023-05-04 MED ORDER — CHLORHEXIDINE GLUCONATE 4 % EX SOLN: Freq: Every day | CUTANEOUS | 0 refills | Status: DC | PRN

## 2023-05-04 MED ORDER — KETOROLAC TROMETHAMINE 30 MG/ML IJ SOLN
INTRAMUSCULAR | Status: AC
  Filled 2023-05-04: qty 1

## 2023-05-04 NOTE — ED Provider Notes (Signed)
[facility]    CSN: [id number] Arrival date & time: 05/04/23  4098      History   Chief Complaint Chief Complaint  Patient presents with   Abscess    HPI Haley [name] is a 22 y.o. female.   Patient presents to clinic over concern of a right gluteal/rectal abscess.  She has had recurrent pilonidal cyst, did have a surgical procedure at [facility] surgery where she had the area removed a few months ago.  Reports her abscess is below this area and has been present for three days.  She is not taking anything for pain.  She is doing warm baths at home.  Denies any fevers, nausea or vomiting.    The history is provided by the patient and medical records.  Abscess Associated symptoms: no fever     Past Medical History:  Diagnosis Date   Anemia    History of chlamydia 08/16/2016   Azithromycin Rx given 11/13   Pilonidal cyst    w/ hx recurrent abscess's   Sickle cell trait [facility])     Patient Active Problem List   Diagnosis Date Noted   Encounter for counseling regarding contraception 12/10/2020   Sickle cell trait (HCC) 06/25/2016    Past Surgical History:  Procedure Laterality Date   EXCISION MASS NECK  2010   per pt benign   PILONIDAL CYST EXCISION N/A 06/03/2021   Procedure: TREPHINATION  OF PILONIDAL CYST;  Surgeon: Berna Bue, MD;  Location: [facility];  Service: General;  Laterality: N/A;    OB History     Gravida  1   Para  1   Term  1   Preterm      AB      Living  1      SAB      IAB      Ectopic      Multiple  0   Live Births  1            Home Medications    Prior to Admission medications   Medication Sig Start Date End Date Taking? Authorizing Provider  chlorhexidine (HIBICLENS) 4 % external liquid Apply topically daily as needed. 05/04/23  Yes Rinaldo Ratel, [place] N, FNP  ibuprofen (ADVIL) 800 MG tablet Take 1 tablet (800 mg total) by mouth 3 (three) times daily. 05/04/23  Yes Rinaldo Ratel,  [place] N, FNP  sulfamethoxazole-trimethoprim (BACTRIM DS) 800-160 MG tablet Take 1 tablet by mouth 2 (two) times daily for 7 days. 05/04/23 05/11/23 Yes Rinaldo Ratel, [place] N, FNP  levonorgestrel (MIRENA) 20 MCG/DAY IUD 1 each by Intrauterine route once.    [provider]    Family History Family History  Problem Relation Age of Onset   Healthy Mother     Social History Social History   Tobacco Use   Smoking status: Every Day    Types: Cigars   Smokeless tobacco: Never  Vaping Use   Vaping status: Every Day   Devices: elfbar  Substance Use Topics   Alcohol use: No   Drug use: No     Allergies   Patient has no known allergies.   Review of Systems Review of Systems  Constitutional:  Negative for fever.     Physical Exam Triage Vital Signs ED Triage Vitals  Encounter Vitals Group     BP 05/04/23 0840 105/69     Systolic BP Percentile --      Diastolic BP Percentile --  Pulse Rate 05/04/23 0840 (!) 117     Resp 05/04/23 0840 16     Temp 05/04/23 0840 98.3 F (36.8 C)     Temp Source 05/04/23 0840 Oral     SpO2 05/04/23 0840 97 %     Weight 05/04/23 0839 217 lb (98.4 kg)     Height 05/04/23 0839 5\' 4"  (1.626 m)     Head Circumference --      Peak Flow --      Pain Score 05/04/23 0839 10     Pain Loc --      Pain Education --      Exclude from Growth Chart --    No data found.  Updated Vital Signs BP 105/69 (BP Location: Left Arm)   Pulse (!) 117   Temp 98.3 F (36.8 C) (Oral)   Resp 16   Ht 5\' 4"  (1.626 m)   Wt 217 lb (98.4 kg)   LMP  (LMP Unknown)   SpO2 97%   BMI 37.25 kg/m   Visual Acuity Right Eye Distance:   Left Eye Distance:   Bilateral Distance:    Right Eye Near:   Left Eye Near:    Bilateral Near:     Physical Exam Vitals and nursing note reviewed.  Constitutional:      Appearance: Normal appearance.  HENT:     Head: Normocephalic and atraumatic.     Right Ear: External ear normal.     Left Ear: External ear  normal.     Nose: Nose normal.     Mouth/Throat:     Mouth: Mucous membranes are moist.  Eyes:     Conjunctiva/sclera: Conjunctivae normal.  Cardiovascular:     Rate and Rhythm: Tachycardia present.  Pulmonary:     Effort: Pulmonary effort is normal. No respiratory distress.  Genitourinary:      Comments: Right gluteal abscess below scarred area of pilonidal cyst removal. Area is tender and indurated. No fluctuance or drainage.  Musculoskeletal:        General: Normal range of motion.  Skin:    General: Skin is warm and dry.  Neurological:     General: No focal deficit present.     Mental Status: She is alert and oriented to person, place, and time.  Psychiatric:        Mood and Affect: Mood normal.        Behavior: Behavior normal. Behavior is cooperative.      UC Treatments / Results  Labs (all labs ordered are listed, but only abnormal results are displayed) Labs Reviewed - No data to display  EKG   Radiology No results found.  Procedures Procedures (including critical care time)  Medications Ordered in UC Medications  ketorolac (TORADOL) 30 MG/ML injection 30 mg (has no administration in time range)    Initial Impression / Assessment and Plan / UC Course  I have reviewed the triage vital signs and the nursing notes.  Pertinent labs & imaging results that were available during my care of the patient were reviewed by me and considered in my medical decision making (see chart for details).  Vitals in triage reviewed, patient is hemodynamically stable.  Has an abscess of the right gluteal/rectal area that is below her previous surgical site for her pilonidal cyst removal.  Area is tender and indurated, without fluctuance or drainage.  Will place on antibiotics and encouraged warm compresses, return to clinic in 72 hours for recheck and potential incision and drainage.  Plan of care, follow-up care and return precautions given, no questions at this time.    Final  Clinical Impressions(s) / UC Diagnoses   Final diagnoses:  Abscess of buttock, right     Discharge Instructions      You have an abscess of your right butt cheek.  Please start taking the antibiotics twice daily for the next 7 days, you can take them with food to help prevent gastrointestinal upset.  Starting tonight you take the ibuprofen for pain and inflammation, we have given you an injection of Toradol today in clinic that should help you throughout the day.  Please continue to do your warm baths, you can also do warm compresses for 10 to 15 minutes 3 times daily with warm water and the Hibiclens.  Return to clinic after 72 hours on antibiotics for a re-check of the area, if the area becomes fluctuant, or squishy in the middle, as it is ready to drain.   Seek immediate care if you develop fever, worsening of abscess despite being on antibiotics, vomiting, or any new concerning symptoms.      ED Prescriptions     Medication Sig Dispense Auth. Provider   sulfamethoxazole-trimethoprim (BACTRIM DS) 800-160 MG tablet Take 1 tablet by mouth 2 (two) times daily for 7 days. 14 tablet Rinaldo Ratel, [place] N, [place]   chlorhexidine (HIBICLENS) 4 % external liquid Apply topically daily as needed. 118 mL Rinaldo Ratel, [place] N, [place]   ibuprofen (ADVIL) 800 MG tablet Take 1 tablet (800 mg total) by mouth 3 (three) times daily. 21 tablet Cadarius Nevares, [place] N, [place]      PDMP not reviewed this encounter.   Rinaldo Ratel [place] N, [place] 05/04/23 [id number]

## 2023-05-04 NOTE — ED Triage Notes (Signed)
Patient here today with c/o abscess at her tailbone 3 days. She has had a procedure done on it a year ago but it keeps coming back.

## 2023-05-04 NOTE — Discharge Instructions (Addendum)
You have an abscess of your right butt cheek.  Please start taking the antibiotics twice daily for the next 7 days, you can take them with food to help prevent gastrointestinal upset.  Starting tonight you take the ibuprofen for pain and inflammation, we have given you an injection of Toradol today in clinic that should help you throughout the day.  Please continue to do your warm baths, you can also do warm compresses for 10 to 15 minutes 3 times daily with warm water and the Hibiclens.  Return to clinic after 72 hours on antibiotics for a re-check of the area, if the area becomes fluctuant, or squishy in the middle, as it is ready to drain.   Seek immediate care if you develop fever, worsening of abscess despite being on antibiotics, vomiting, or any new concerning symptoms.

## 2023-05-04 NOTE — ED Notes (Signed)
Provided extra guaze for home care

## 2023-05-05 ENCOUNTER — Ambulatory Visit (HOSPITAL_COMMUNITY): Admission: EM | Admit: 2023-05-05 | Discharge: 2023-05-05 | Discharge status: Against Medical Advice | Payer: MEDICAID

## 2023-05-05 ENCOUNTER — Encounter (HOSPITAL_COMMUNITY): Payer: Self-pay

## 2023-05-05 DIAGNOSIS — Z5321 Procedure and treatment not carried out due to patient leaving prior to being seen by health care provider: Secondary | ICD-10-CM

## 2023-05-05 NOTE — ED Notes (Signed)
Pt left before seeing provider 

## 2023-05-05 NOTE — ED Triage Notes (Signed)
Pt states that she's been having sx for a month. Constant dizziness and nausea. She spit up blood last night from drainage. She also feels a knot in her right breast near her nipple.

## 2023-05-12 ENCOUNTER — Ambulatory Visit: Payer: Self-pay | Admitting: Surgery

## 2023-05-12 NOTE — H&P (Signed)
Tera Helper [id number]   Referring Provider:  Self   Subjective   Chief Complaint: Follow-up (Pilo cyst ltf, possble recurrence)     History of Present Illness:    22 year old woman who presents for evaluation of pilonidal cyst.  She had trephination of a pilonidal cyst in August 2022.  She did not ever return for postoperative follow-up although she did call a few times reporting that she had recurrent disease.  She states that the lesion that was trephinated did recur and drain however in the last year or 2 more so just swells but does not ever drain and has been more quiescent.  She was last seen here in urgent office in April 2023, which was also the first time she had been seen since her procedure.  At that time reported that she had had 4 recurrences, drained at urgent cares, and had another recurrence with a large abscess that was drained in urgent office.  She was instructed to follow-up with me but did not do so.  She was seen again in the emergency department recently with an abscess more inferiorly on the left buttock which was I&D'd and notes that she has had recurrent flares in this area which is separate from the area that was operated on.    Review of Systems: A complete review of systems was obtained from the patient.  I have reviewed this information and discussed as appropriate with the patient.  See HPI as well for other ROS.   Medical History: History reviewed. No pertinent past medical history.  There is no problem list on file for this patient.   Past Surgical History:  Procedure Laterality Date   PILONIDAL CYST / SINUS EXCISION  06/03/2021   Dr Fredricka Bonine     No Known Allergies  No current outpatient medications on file prior to visit.   No current facility-administered medications on file prior to visit.    Family History  Problem Relation Age of Onset   Diabetes Mother    Deep vein thrombosis (DVT or abnormal blood clot formation) Mother    Colon  cancer Mother    Breast cancer Mother      Social History   Tobacco Use  Smoking Status Former   Current packs/day: 0.00   Types: Cigarettes   Quit date: 2021   Years since quitting: 3.6  Smokeless Tobacco Never     Social History   Socioeconomic History   Marital status: Single  Tobacco Use   Smoking status: Former    Current packs/day: 0.00    Types: Cigarettes    Quit date: 2021    Years since quitting: 3.6   Smokeless tobacco: Never  Vaping Use   Vaping status: Never Used  Substance and Sexual Activity   Alcohol use: Not Currently   Drug use: Never   Sexual activity: Defer    Objective:    Vitals:   05/12/23 1108  PainSc: 0-No pain    There is no height or weight on file to calculate BMI.  Gen: A&Ox3, no distress  Unlabored respirations At the superior aspect of the natal cleft is a chronic scar with no surrounding inflammatory change.  Shallow healed midline pits inferior to this.  inferior to these pits, to the left of midline on the gluteus is an area of scar from previous I&D, and inferior to that are several more pits  Assessment and Plan:  Diagnoses and all orders for this visit:  Pilonidal disease  Appears  to have a new lesion separate from the area that was previously operated on with recurrent inflammation.  I recommend repeating the gips procedure and she is agreeable.  Genesee Nase Carlye Grippe, MD

## 2023-06-16 ENCOUNTER — Encounter (HOSPITAL_BASED_OUTPATIENT_CLINIC_OR_DEPARTMENT_OTHER): Payer: Self-pay | Admitting: Surgery

## 2023-06-16 NOTE — Progress Notes (Signed)
Spoke w/ via phone for pre-op interview--- pt Lab needs dos----   urine preg            Lab results------ no COVID test -----patient states asymptomatic no test needed Arrive at ------- 1215 on 06-27-2023 NPO after MN NO Solid Food.  Clear liquids from MN until--- 1115 Med rec completed Medications to take morning of surgery ----- none Diabetic medication ----- n/a Patient instructed no nail polish to be worn day of surgery Patient instructed to bring photo id and insurance card day of surgery Patient aware to have Driver (ride ) / caregiver    for 24 hours after surgery -- friend, mary Patient Special Instructions ----- pt verbalized understanding to obtain hibiclens soap from any pharmacy and shower night before and am dos of surgery chin to toe Pre-Op special Instructions ----- n/a Patient verbalized understanding of instructions that were given at this phone interview. Patient denies shortness of breath, chest pain, fever, cough at this phone interview.

## 2023-06-23 ENCOUNTER — Other Ambulatory Visit: Payer: Self-pay

## 2023-06-23 ENCOUNTER — Emergency Department (HOSPITAL_COMMUNITY): Payer: MEDICAID

## 2023-06-23 ENCOUNTER — Encounter (HOSPITAL_COMMUNITY): Payer: Self-pay

## 2023-06-23 ENCOUNTER — Emergency Department (HOSPITAL_COMMUNITY)
Admission: EM | Admit: 2023-06-23 | Discharge: 2023-06-23 | Disposition: A | Discharge status: Home / Self Care | Payer: MEDICAID

## 2023-06-23 DIAGNOSIS — S0990XA Unspecified injury of head, initial encounter: Secondary | ICD-10-CM | POA: Diagnosis not present

## 2023-06-23 DIAGNOSIS — M25512 Pain in left shoulder: Secondary | ICD-10-CM | POA: Diagnosis not present

## 2023-06-23 DIAGNOSIS — S81011A Laceration without foreign body, right knee, initial encounter: Secondary | ICD-10-CM

## 2023-06-23 DIAGNOSIS — S80212A Abrasion, left knee, initial encounter: Secondary | ICD-10-CM | POA: Insufficient documentation

## 2023-06-23 DIAGNOSIS — M25511 Pain in right shoulder: Secondary | ICD-10-CM | POA: Insufficient documentation

## 2023-06-23 DIAGNOSIS — Z79899 Other long term (current) drug therapy: Secondary | ICD-10-CM | POA: Insufficient documentation

## 2023-06-23 DIAGNOSIS — Y92410 Unspecified street and highway as the place of occurrence of the external cause: Secondary | ICD-10-CM | POA: Insufficient documentation

## 2023-06-23 DIAGNOSIS — T07XXXA Unspecified multiple injuries, initial encounter: Secondary | ICD-10-CM

## 2023-06-23 DIAGNOSIS — S8991XA Unspecified injury of right lower leg, initial encounter: Secondary | ICD-10-CM | POA: Diagnosis present

## 2023-06-23 DIAGNOSIS — Z23 Encounter for immunization: Secondary | ICD-10-CM | POA: Diagnosis not present

## 2023-06-23 LAB — CBC WITH DIFFERENTIAL/PLATELET
Abs Immature Granulocytes: 0.02 10*3/uL (ref 0.00–0.07)
Basophils Absolute: 0.1 10*3/uL (ref 0.0–0.1)
Basophils Relative: 1 %
Eosinophils Absolute: 0.2 10*3/uL (ref 0.0–0.5)
Eosinophils Relative: 2 %
HCT: 30.9 % — ABNORMAL LOW (ref 36.0–46.0)
Hemoglobin: 10.4 g/dL — ABNORMAL LOW (ref 12.0–15.0)
Immature Granulocytes: 0 %
Lymphocytes Relative: 19 %
Lymphs Abs: 1.8 10*3/uL (ref 0.7–4.0)
MCH: 25.2 pg — ABNORMAL LOW (ref 26.0–34.0)
MCHC: 33.7 g/dL (ref 30.0–36.0)
MCV: 74.8 fL — ABNORMAL LOW (ref 80.0–100.0)
Monocytes Absolute: 0.7 10*3/uL (ref 0.1–1.0)
Monocytes Relative: 7 %
Neutro Abs: 6.6 10*3/uL (ref 1.7–7.7)
Neutrophils Relative %: 71 %
Platelets: 425 10*3/uL — ABNORMAL HIGH (ref 150–400)
RBC: 4.13 MIL/uL (ref 3.87–5.11)
RDW: 16.7 % — ABNORMAL HIGH (ref 11.5–15.5)
WBC: 9.3 10*3/uL (ref 4.0–10.5)
nRBC: 0 % (ref 0.0–0.2)

## 2023-06-23 LAB — RAPID URINE DRUG SCREEN, HOSP PERFORMED
Amphetamines: NOT DETECTED
Barbiturates: NOT DETECTED
Benzodiazepines: NOT DETECTED
Cocaine: NOT DETECTED
Opiates: NOT DETECTED
Tetrahydrocannabinol: NOT DETECTED

## 2023-06-23 LAB — TYPE AND SCREEN
ABO/RH(D): B POS
Antibody Screen: NEGATIVE

## 2023-06-23 LAB — HCG, SERUM, QUALITATIVE: Preg, Serum: NEGATIVE

## 2023-06-23 MED ORDER — ONDANSETRON HCL 4 MG/2ML IJ SOLN
4.0000 mg | Freq: Once | INTRAMUSCULAR | Status: AC
  Administered 2023-06-23: 4 mg via INTRAVENOUS
  Filled 2023-06-23: qty 2

## 2023-06-23 MED ORDER — IBUPROFEN 600 MG PO TABS
600.0000 mg | ORAL_TABLET | Freq: Four times a day (QID) | ORAL | 0 refills | Status: AC | PRN
Start: 2023-06-23 — End: ?

## 2023-06-23 MED ORDER — LIDOCAINE-EPINEPHRINE (PF) 2 %-1:200000 IJ SOLN
10.0000 mL | Freq: Once | INTRAMUSCULAR | Status: AC
  Administered 2023-06-23: 10 mL via INTRADERMAL
  Filled 2023-06-23: qty 20

## 2023-06-23 MED ORDER — TETANUS-DIPHTH-ACELL PERTUSSIS 5-2.5-18.5 LF-MCG/0.5 IM SUSY
0.5000 mL | PREFILLED_SYRINGE | Freq: Once | INTRAMUSCULAR | Status: AC
  Administered 2023-06-23: 0.5 mL via INTRAMUSCULAR
  Filled 2023-06-23: qty 0.5

## 2023-06-23 MED ORDER — CYCLOBENZAPRINE HCL 10 MG PO TABS
10.0000 mg | ORAL_TABLET | Freq: Two times a day (BID) | ORAL | 0 refills | Status: AC | PRN
Start: 2023-06-23 — End: ?

## 2023-06-23 MED ORDER — MORPHINE SULFATE (PF) 4 MG/ML IV SOLN
4.0000 mg | Freq: Once | INTRAVENOUS | Status: AC
  Administered 2023-06-23: 4 mg via INTRAVENOUS
  Filled 2023-06-23: qty 1

## 2023-06-23 MED ORDER — SODIUM CHLORIDE 0.9 % IV BOLUS
1000.0000 mL | Freq: Once | INTRAVENOUS | Status: AC
  Administered 2023-06-23: 1000 mL via INTRAVENOUS
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
## 2023-06-23 NOTE — ED Notes (Signed)
Ortho paged, call returned and informed of needed arm sling

## 2023-06-23 NOTE — ED Notes (Signed)
Pt returned form scans

## 2023-06-23 NOTE — ED Notes (Signed)
Pt transported to xray and CT

## 2023-06-23 NOTE — Progress Notes (Signed)
Orthopedic Tech Progress Note Patient Details:  Haley [name] [birth year] [id number]  Ortho Devices Type of Ortho Device: Arm sling Ortho Device/Splint Location: LUE Ortho Device/Splint Interventions: Application, Ordered   Post Interventions Patient Tolerated: Well Instructions Provided: Adjustment of device  Draeden Kellman A Tana Trefry 06/23/2023, 3:04 PM
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
## 2023-06-24 NOTE — Anesthesia Preprocedure Evaluation (Signed)
Anesthesia Evaluation  Patient identified by MRN, date of birth, ID band Patient awake    Reviewed: Allergy & Precautions, NPO status , Patient's Chart, lab work & pertinent test results  Airway Mallampati: II  TM Distance: >3 FB Neck ROM: Full    Dental  (+) Dental Advisory Given, Missing, Chipped   Pulmonary Current Smoker and Patient abstained from smoking.   Pulmonary exam normal breath sounds clear to auscultation       Cardiovascular negative cardio ROS Normal cardiovascular exam Rhythm:Regular Rate:Normal     Neuro/Psych negative neurological ROS  negative psych ROS   GI/Hepatic negative GI ROS, Neg liver ROS,,,  Endo/Other  Obesity BMI 39  Renal/GU negative Renal ROS  negative genitourinary   Musculoskeletal negative musculoskeletal ROS (+)    Abdominal  (+) + obese  Peds  Hematology  (+) Blood dyscrasia, Sickle cell trait and anemia Hb 10.4   Anesthesia Other Findings   Reproductive/Obstetrics                             Anesthesia Physical Anesthesia Plan  ASA: 2  Anesthesia Plan: General   Post-op Pain Management: Tylenol PO (pre-op)* and Toradol IV (intra-op)*   Induction: Intravenous  PONV Risk Score and Plan: 2 and Ondansetron, Dexamethasone, Midazolam and Treatment may vary due to age or medical condition  Airway Management Planned: Oral ETT  Additional Equipment: None  Intra-op Plan:   Post-operative Plan: Extubation in OR  Informed Consent: I have reviewed the patients History and Physical, chart, labs and discussed the procedure including the risks, benefits and alternatives for the proposed anesthesia with the patient or authorized representative who has indicated his/her understanding and acceptance.     Dental advisory given  Plan Discussed with: CRNA  Anesthesia Plan Comments: (Last airway note: Induction Type: IV induction Ventilation: Mask  ventilation without difficulty Laryngoscope Size: Miller and 2 Grade View: Grade I Tube type: Oral Tube size: 7.0 mm Number of attempts: 1 )        Anesthesia Quick Evaluation

## 2023-06-27 ENCOUNTER — Encounter (HOSPITAL_BASED_OUTPATIENT_CLINIC_OR_DEPARTMENT_OTHER): Payer: Self-pay | Admitting: Surgery

## 2023-06-27 ENCOUNTER — Ambulatory Visit (HOSPITAL_BASED_OUTPATIENT_CLINIC_OR_DEPARTMENT_OTHER): Payer: MEDICAID | Admitting: Anesthesiology

## 2023-06-27 ENCOUNTER — Encounter (HOSPITAL_BASED_OUTPATIENT_CLINIC_OR_DEPARTMENT_OTHER)
Admission: RE | Disposition: A | Discharge status: Home / Self Care | Payer: Self-pay | Source: Home / Self Care | Attending: Surgery

## 2023-06-27 ENCOUNTER — Other Ambulatory Visit: Payer: Self-pay

## 2023-06-27 ENCOUNTER — Ambulatory Visit (HOSPITAL_BASED_OUTPATIENT_CLINIC_OR_DEPARTMENT_OTHER)
Admission: RE | Admit: 2023-06-27 | Discharge: 2023-06-27 | Disposition: A | Discharge status: Home / Self Care | Payer: MEDICAID | Attending: Surgery | Admitting: Surgery

## 2023-06-27 DIAGNOSIS — Z87891 Personal history of nicotine dependence: Secondary | ICD-10-CM | POA: Insufficient documentation

## 2023-06-27 DIAGNOSIS — L0591 Pilonidal cyst without abscess: Secondary | ICD-10-CM | POA: Insufficient documentation

## 2023-06-27 DIAGNOSIS — D573 Sickle-cell trait: Secondary | ICD-10-CM | POA: Insufficient documentation

## 2023-06-27 DIAGNOSIS — Z01818 Encounter for other preprocedural examination: Secondary | ICD-10-CM

## 2023-06-27 HISTORY — PX: CYST EXCISION: SHX5701

## 2023-06-27 LAB — POCT PREGNANCY, URINE: Preg Test, Ur: NEGATIVE

## 2023-06-27 SURGERY — CYST REMOVAL
Anesthesia: General | Site: Back

## 2023-06-27 MED ORDER — ROCURONIUM BROMIDE 100 MG/10ML IV SOLN
INTRAVENOUS | Status: DC | PRN
  Administered 2023-06-27: 40 mg via INTRAVENOUS

## 2023-06-27 MED ORDER — LIDOCAINE HCL (PF) 2 % IJ SOLN
INTRAMUSCULAR | Status: AC
  Filled 2023-06-27: qty 10

## 2023-06-27 MED ORDER — ACETAMINOPHEN 500 MG PO TABS
ORAL_TABLET | ORAL | Status: AC
  Filled 2023-06-27: qty 2

## 2023-06-27 MED ORDER — CEFAZOLIN SODIUM-DEXTROSE 2-4 GM/100ML-% IV SOLN
2.0000 g | INTRAVENOUS | Status: AC
  Administered 2023-06-27: 2 g via INTRAVENOUS

## 2023-06-27 MED ORDER — ONDANSETRON HCL 4 MG/2ML IJ SOLN
INTRAMUSCULAR | Status: DC | PRN
  Administered 2023-06-27: 4 mg via INTRAVENOUS

## 2023-06-27 MED ORDER — 0.9 % SODIUM CHLORIDE (POUR BTL) OPTIME
TOPICAL | Status: DC | PRN
Start: 2023-06-27 — End: 2023-06-27
  Administered 2023-06-27: 500 mL

## 2023-06-27 MED ORDER — DEXAMETHASONE SODIUM PHOSPHATE 4 MG/ML IJ SOLN
INTRAMUSCULAR | Status: DC | PRN
  Administered 2023-06-27: 8 mg via INTRAVENOUS

## 2023-06-27 MED ORDER — GABAPENTIN 300 MG PO CAPS
300.0000 mg | ORAL_CAPSULE | ORAL | Status: AC
  Administered 2023-06-27: 300 mg via ORAL

## 2023-06-27 MED ORDER — EPHEDRINE 5 MG/ML INJ
INTRAVENOUS | Status: AC
  Filled 2023-06-27: qty 5

## 2023-06-27 MED ORDER — DEXMEDETOMIDINE HCL IN NACL 80 MCG/20ML IV SOLN
INTRAVENOUS | Status: DC | PRN
  Administered 2023-06-27: 8 ug via INTRAVENOUS

## 2023-06-27 MED ORDER — CHLORHEXIDINE GLUCONATE 4 % EX SOLN: 60.0000 mL | Freq: Once | CUTANEOUS | Status: DC

## 2023-06-27 MED ORDER — GABAPENTIN 300 MG PO CAPS
ORAL_CAPSULE | ORAL | Status: AC
  Filled 2023-06-27: qty 1

## 2023-06-27 MED ORDER — KETOROLAC TROMETHAMINE 30 MG/ML IJ SOLN
INTRAMUSCULAR | Status: DC | PRN
  Administered 2023-06-27: 30 mg via INTRAVENOUS

## 2023-06-27 MED ORDER — ROCURONIUM BROMIDE 10 MG/ML (PF) SYRINGE
PREFILLED_SYRINGE | INTRAVENOUS | Status: AC
  Filled 2023-06-27: qty 20

## 2023-06-27 MED ORDER — DEXAMETHASONE SODIUM PHOSPHATE 10 MG/ML IJ SOLN
INTRAMUSCULAR | Status: AC
  Filled 2023-06-27: qty 3

## 2023-06-27 MED ORDER — ACETAMINOPHEN 500 MG PO TABS
1000.0000 mg | ORAL_TABLET | ORAL | Status: AC
  Administered 2023-06-27: 1000 mg via ORAL

## 2023-06-27 MED ORDER — FENTANYL CITRATE (PF) 100 MCG/2ML IJ SOLN
INTRAMUSCULAR | Status: DC | PRN
  Administered 2023-06-27 (×2): 50 ug via INTRAVENOUS

## 2023-06-27 MED ORDER — BUPIVACAINE-EPINEPHRINE 0.25% -1:200000 IJ SOLN
INTRAMUSCULAR | Status: DC | PRN
  Administered 2023-06-27: 30 mL

## 2023-06-27 MED ORDER — CEFAZOLIN SODIUM-DEXTROSE 2-4 GM/100ML-% IV SOLN
INTRAVENOUS | Status: AC
  Filled 2023-06-27: qty 100

## 2023-06-27 MED ORDER — FENTANYL CITRATE (PF) 100 MCG/2ML IJ SOLN
INTRAMUSCULAR | Status: AC
  Filled 2023-06-27: qty 2

## 2023-06-27 MED ORDER — TRAMADOL HCL 50 MG PO TABS: 50.0000 mg | ORAL_TABLET | Freq: Four times a day (QID) | ORAL | 0 refills | Status: AC | PRN

## 2023-06-27 MED ORDER — LIDOCAINE HCL (CARDIAC) PF 100 MG/5ML IV SOSY
PREFILLED_SYRINGE | INTRAVENOUS | Status: DC | PRN
  Administered 2023-06-27: 80 mg via INTRAVENOUS

## 2023-06-27 MED ORDER — DOCUSATE SODIUM 100 MG PO CAPS: 100.0000 mg | ORAL_CAPSULE | Freq: Two times a day (BID) | ORAL | 0 refills | Status: AC

## 2023-06-27 MED ORDER — PHENYLEPHRINE 80 MCG/ML (10ML) SYRINGE FOR IV PUSH (FOR BLOOD PRESSURE SUPPORT)
PREFILLED_SYRINGE | INTRAVENOUS | Status: AC
  Filled 2023-06-27: qty 10

## 2023-06-27 MED ORDER — LACTATED RINGERS IV SOLN: INTRAVENOUS | Status: DC

## 2023-06-27 MED ORDER — ACETAMINOPHEN 500 MG PO TABS: 1000.0000 mg | ORAL_TABLET | Freq: Once | ORAL | Status: DC

## 2023-06-27 MED ORDER — PHENYLEPHRINE HCL (PRESSORS) 10 MG/ML IV SOLN
INTRAVENOUS | Status: DC | PRN
  Administered 2023-06-27: 160 ug via INTRAVENOUS

## 2023-06-27 MED ORDER — MIDAZOLAM HCL 2 MG/2ML IJ SOLN
INTRAMUSCULAR | Status: AC
  Filled 2023-06-27: qty 2

## 2023-06-27 MED ORDER — KETOROLAC TROMETHAMINE 30 MG/ML IJ SOLN
INTRAMUSCULAR | Status: AC
  Filled 2023-06-27: qty 3

## 2023-06-27 MED ORDER — PROPOFOL 10 MG/ML IV BOLUS
INTRAVENOUS | Status: DC | PRN
  Administered 2023-06-27: 180 mg via INTRAVENOUS
  Administered 2023-06-27: 20 mg via INTRAVENOUS

## 2023-06-27 MED ORDER — SUGAMMADEX SODIUM 200 MG/2ML IV SOLN
INTRAVENOUS | Status: DC | PRN
  Administered 2023-06-27: 200 mg via INTRAVENOUS

## 2023-06-27 MED ORDER — ONDANSETRON HCL 4 MG/2ML IJ SOLN
INTRAMUSCULAR | Status: AC
  Filled 2023-06-27: qty 6

## 2023-06-27 MED ORDER — MIDAZOLAM HCL 2 MG/2ML IJ SOLN
INTRAMUSCULAR | Status: DC | PRN
  Administered 2023-06-27: 2 mg via INTRAVENOUS

## 2023-06-27 SURGICAL SUPPLY — 48 items
ADH SKN CLS APL DERMABOND .7 (GAUZE/BANDAGES/DRESSINGS)
APL PRP STRL LF DISP 70% ISPRP (MISCELLANEOUS)
BD Insyte Autoguard 14 gauge IV catheter IMPLANT
BLADE SURG 15 STRL LF DISP TIS (BLADE) ×2 IMPLANT
BLADE SURG 15 STRL SS (BLADE) ×1
BRIEF MESH DISP LRG (UNDERPADS AND DIAPERS) IMPLANT
CHLORAPREP W/TINT 26 (MISCELLANEOUS) IMPLANT
COVER BACK TABLE 60X90IN (DRAPES) ×2 IMPLANT
COVER MAYO STAND STRL (DRAPES) ×2 IMPLANT
DERMABOND ADVANCED .7 DNX12 (GAUZE/BANDAGES/DRESSINGS) IMPLANT
DRAPE LAPAROTOMY 100X72 PEDS (DRAPES) IMPLANT
DRAPE LAPAROTOMY TRNSV 102X78 (DRAPES) IMPLANT
DRAPE UTILITY XL STRL (DRAPES) ×2 IMPLANT
ELECT REM PT RETURN 9FT ADLT (ELECTROSURGICAL) ×1
ELECTRODE REM PT RTRN 9FT ADLT (ELECTROSURGICAL) ×2 IMPLANT
GAUZE 4X4 16PLY ~~LOC~~+RFID DBL (SPONGE) ×2 IMPLANT
GAUZE PAD ABD 8X10 STRL (GAUZE/BANDAGES/DRESSINGS) IMPLANT
GAUZE SPONGE 4X4 12PLY STRL (GAUZE/BANDAGES/DRESSINGS) ×2 IMPLANT
GLOVE BIO SURGEON STRL SZ 6 (GLOVE) ×2 IMPLANT
GLOVE INDICATOR 6.5 STRL GRN (GLOVE) ×2 IMPLANT
GOWN STRL REUS W/TWL LRG LVL3 (GOWN DISPOSABLE) ×2 IMPLANT
KIT SIGMOIDOSCOPE (SET/KITS/TRAYS/PACK) IMPLANT
KIT TURNOVER CYSTO (KITS) ×2 IMPLANT
NDL HYPO 22X1.5 SAFETY MO (MISCELLANEOUS) IMPLANT
NDL HYPO 25X1 1.5 SAFETY (NEEDLE) IMPLANT
NEEDLE HYPO 22X1.5 SAFETY MO (MISCELLANEOUS)
NEEDLE HYPO 25X1 1.5 SAFETY (NEEDLE) ×1
PACK BASIN DAY SURGERY FS (CUSTOM PROCEDURE TRAY) ×2 IMPLANT
PENCIL SMOKE EVACUATOR (MISCELLANEOUS) ×2 IMPLANT
PUNCH BIOPSY 4MM (MISCELLANEOUS) ×1
PUNCH BIOPSY DISP 4 (MISCELLANEOUS) IMPLANT
SLEEVE SCD COMPRESS KNEE MED (STOCKING) ×2 IMPLANT
SPONGE T-LAP 18X18 ~~LOC~~+RFID (SPONGE) IMPLANT
SPONGE T-LAP 4X18 ~~LOC~~+RFID (SPONGE) IMPLANT
STAPLER VISISTAT 35W (STAPLE) IMPLANT
SUCTION TUBE FRAZIER 10FR DISP (SUCTIONS) IMPLANT
SUT CHROMIC 3 0 SH 27 (SUTURE) IMPLANT
SUT MNCRL AB 4-0 PS2 18 (SUTURE) ×2 IMPLANT
SUT VIC AB 2-0 SH 27 (SUTURE)
SUT VIC AB 2-0 SH 27XBRD (SUTURE) IMPLANT
SUT VIC AB 3-0 SH 27 (SUTURE)
SUT VIC AB 3-0 SH 27X BRD (SUTURE) ×2 IMPLANT
SYR BULB IRRIG 60ML STRL (SYRINGE) ×2 IMPLANT
SYR CONTROL 10ML LL (SYRINGE) ×2 IMPLANT
TOWEL OR 17X24 6PK STRL BLUE (TOWEL DISPOSABLE) ×2 IMPLANT
TRAY DSU PREP LF (CUSTOM PROCEDURE TRAY) IMPLANT
TUBE CONNECTING 12X1/4 (SUCTIONS) ×2 IMPLANT
YANKAUER SUCT BULB TIP NO VENT (SUCTIONS) ×2 IMPLANT

## 2023-06-27 NOTE — Discharge Instructions (Addendum)
Postoperative Instructions Surgery for Pilonidal Disease Restrictions  You may shower after 24 hours but you should avoid soaking in water for more than ten minutes.  There are no dietary restrictions.  You should avoid alcohol while you are on narcotic pain medication.  Activity can be as tolerated.  Wound care - Your incision was intentionally left open. You should pack the incision with gauze, moistened with saline, covered with a dry dressing and secured with tape. This should be changed at least daily or whenever the dressing gets wet. The wound will heal over the next few weeks.  -It is not unusual to have a small amount of bleeding when you first remove the packing.  If this occurs, fold up a new piece of gauze and apply this to the area of oozing and apply direct pressure for 15 to 20 minutes.  This will typically resolve the issue.  You may need to repeat this. - Hair removal using clippers, waxing or depilatory creams around the area will help reduce the chance of recurrence. This can be initiated once the wound starts to heal.    Medications  You will be given a prescription for pain medicine when you are discharged from the hospital. In addition to the prescription pain medicine that you were given, you may take Motrin, Advil, or ibuprofen at the same time. This often gives better pain relief than either one by itself.  Stop the prescription and switch to Motrin, Advil, or ibuprofen as soon as these medications can control your pain. (This will reduce your risk of constipation.) -Most patients will experience some swelling and bruising around the incisions.  Ice packs or heating pads (30-60 minutes up to 6 times a day) will help. Use ice for the first few days to help decrease swelling and bruising, then switch to heat to help relax tight/sore spots and speed recovery.  Some people prefer to use ice alone, heat alone, alternating between ice & heat.  Experiment to what works for  you.  Swelling and bruising can take several weeks to resolve.  Take Colace 100 mg two times daily until you are seen in the office for your followup visit.  Call the office if:  The pain worsens and you need to increase the amount of pain medication.  You experience persistent fever or chills. (You may have lowgrade fevers on and off for the days following surgery? this is your body's normal reaction to surgery.)  There is redness of more than  inch around the incisions.  There is drainage of cloudy fluid or pus (Drainage of a yellowish bloody fluid is normal.)  You experience persistent nausea or vomiting.   Please call the office [phone]) to make a followup appointment for one to two weeks after surgery and if you have any other problems, questions, or concerns.   The clinic staff is available to answer your questions during regular business hours (8:30am-5pm).  Please don't hesitate to call and ask to speak to one of our nurses for clinical concerns.              If you have disability or family leave forms, bring them to the office for processing. Do not give them to your doctor.  If you have a medical emergency, go to the nearest emergency room or call 911.  A surgeon from [facility] is always on call at the [facility], [place] [address], [address], [place],  [place]  [address] ? MAIN: [phone] ? TOLL FREE: [phone] ?  FAX [phone] [email or web address]     Post Anesthesia Home Care Instructions  Activity: Get plenty of rest for the remainder of the day. A responsible individual must stay with you for 24 hours following the procedure.  For the next 24 hours, DO NOT: -Drive a car -Advertising copywriter -Drink alcoholic beverages -Take any medication unless instructed by your physician -Make any legal decisions or sign important papers.  Meals: Start with liquid foods such as gelatin or soup.  Progress to regular foods as tolerated. Avoid greasy, spicy, heavy foods. If nausea and/or vomiting occur, drink only clear liquids until the nausea and/or vomiting subsides. Call your physician if vomiting continues.  Special Instructions/Symptoms: Your throat may feel dry or sore from the anesthesia or the breathing tube placed in your throat during surgery. If this causes discomfort, gargle with warm salt water. The discomfort should disappear within 24 hours.  If you had a scopolamine patch placed behind your ear for the management of post- operative nausea and/or vomiting:  1. The medication in the patch is effective for 72 hours, after which it should be removed.  Wrap patch in a tissue and discard in the trash. Wash hands thoroughly with soap and water. 2. You may remove the patch earlier than 72 hours if you experience unpleasant side effects which may include dry mouth, dizziness or visual disturbances. 3. Avoid touching the patch. Wash your hands with soap and water after contact with the patch.  If needed, do not take any Tylenol until after 5:45 pm today.

## 2023-06-27 NOTE — Transfer of Care (Signed)
Immediate Anesthesia Transfer of Care Note  Patient: Haley [name]  Procedure(s) Performed: TREPHINATION OF PILONIDAL CYST (Back)  Patient Location: PACU  Anesthesia Type:General  Level of Consciousness: drowsy and patient cooperative  Airway & Oxygen Therapy: Patient Spontanous Breathing and Patient connected to nasal cannula oxygen  Post-op Assessment: Report given to RN and Post -op Vital signs reviewed and stable  Post vital signs: Reviewed and stable  Last Vitals:  Vitals Value Taken Time  BP    Temp    Pulse 100 06/27/23 1457  Resp 29 06/27/23 1457  SpO2 98 % 06/27/23 1457  Vitals shown include unfiled device data.  Last Pain:  Vitals:   06/27/23 1154  TempSrc: Oral  PainSc: 0-No pain      Patients Stated Pain Goal: 5 (06/27/23 1154)  Complications: No notable events documented.

## 2023-06-27 NOTE — H&P (Signed)
Haley [name] [id number]   Referring Provider:  Self   Subjective   Chief Complaint: Follow-up (Pilo cyst ltf, possble recurrence)     History of Present Illness:    22 year old woman who presents for evaluation of pilonidal cyst.  She had trephination of a pilonidal cyst in August 2022.  She did not ever return for postoperative follow-up although she did call a few times reporting that she had recurrent disease.  She states that the lesion that was trephinated did recur and drain however in the last year or 2 more so just swells but does not ever drain and has been more quiescent.  She was last seen here in urgent office in April 2023, which was also the first time she had been seen since her procedure.  At that time reported that she had had 4 recurrences, drained at urgent cares, and had another recurrence with a large abscess that was drained in urgent office.  She was instructed to follow-up with me but did not do so.  She was seen again in the emergency department recently with an abscess more inferiorly on the left buttock which was I&D'd and notes that she has had recurrent flares in this area which is separate from the area that was operated on.    Review of Systems: A complete review of systems was obtained from the patient.  I have reviewed this information and discussed as appropriate with the patient.  See HPI as well for other ROS.   Medical History: History reviewed. No pertinent past medical history.  There is no problem list on file for this patient.   Past Surgical History:  Procedure Laterality Date   PILONIDAL CYST / SINUS EXCISION  06/03/2021   Dr Fredricka Bonine     No Known Allergies  No current outpatient medications on file prior to visit.   No current facility-administered medications on file prior to visit.    Family History  Problem Relation Age of Onset   Diabetes Mother    Deep vein thrombosis (DVT or abnormal blood clot formation) Mother    Colon  cancer Mother    Breast cancer Mother      Social History   Tobacco Use  Smoking Status Former   Current packs/day: 0.00   Types: Cigarettes   Quit date: 2021   Years since quitting: 3.6  Smokeless Tobacco Never     Social History   Socioeconomic History   Marital status: Single  Tobacco Use   Smoking status: Former    Current packs/day: 0.00    Types: Cigarettes    Quit date: 2021    Years since quitting: 3.6   Smokeless tobacco: Never  Vaping Use   Vaping status: Never Used  Substance and Sexual Activity   Alcohol use: Not Currently   Drug use: Never   Sexual activity: Defer    Objective:    Vitals:   05/12/23 1108  PainSc: 0-No pain    There is no height or weight on file to calculate BMI.  Gen: A&Ox3, no distress  Unlabored respirations At the superior aspect of the natal cleft is a chronic scar with no surrounding inflammatory change.  Shallow healed midline pits inferior to this.  inferior to these pits, to the left of midline on the gluteus is an area of scar from previous I&D, and inferior to that are several more pits  Assessment and Plan:  Diagnoses and all orders for this visit:  Pilonidal disease  Appears  to have a new lesion separate from the area that was previously operated on with recurrent inflammation.  I recommend repeating the gips procedure and she is agreeable.  Rondey Fallen Carlye Grippe, MD

## 2023-06-27 NOTE — Op Note (Signed)
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  The patient was then repositioned prone with all pressure points appropriately padded.  The buttocks were gently spread and taped apart and then the natal cleft and surrounding area was prepped and draped in usual sterile fashion.   On inspection there is a small chronic wound at the superior aspect of the natal cleft, a small midline opening about 6 cm inferior to this, and then a small pit about 2 cm inferior to this.  The most inferior pit was cored out with a 4 mm punch and the sinus tract was easily probed with a lacrimal duct probe and noted to extend superiorly to the small open wound/prior pit in the midline which was also cored out with a 4 mm punch.  This tracked inferior laterally to the patient's left to a small abscess cavity which was unroofed excising a small core of skin and subcu tissue with the Bovie.  This more cephalad pit also tract superiorly to the small chronic wound from her prior excision approximately at the midline just superior to the natal cleft as well as to an abscess cavity again just left of midline.  The chronic wound superiorly was excised with cautery and the superior aspect of the cyst wall/chronic granulation tissue was excised with cautery.  The small abscess cavity to  the left of midline was again unroofed excising a small core of skin and subcutaneous tissue with the cautery.  In total 5 wounds were created.  The sinus tracts, abscess cavity/cyst cavities were all debrided with a small curette followed by a Ray-Tec to remove as much of the chronic nonhealing granulation tissue as possible.  No overt entrapped foreign material was present.  The sinus tracts and cyst cavities were then flushed with hydrogen peroxide to attempt to evacuate any microscopic material and then finally flushed with pressurized sterile saline.  The wounds were then inspected for hemostasis which was confirmed.  The corner of a 4 x 4 was tucked into each wound to gently pack this and then dry gauze dressings were applied.  The patient was then awakened, extubated and taken to PACU in stable condition.    All counts were correct at the completion of the case.

## 2023-06-27 NOTE — Anesthesia Procedure Notes (Signed)
Procedure Name: Intubation Date/Time: 06/27/2023 1:54 PM  Performed by: Earmon Phoenix, CRNAPre-anesthesia Checklist: Patient identified, Emergency Drugs available, Suction available and Patient being monitored Patient Re-evaluated:Patient Re-evaluated prior to induction Oxygen Delivery Method: Circle system utilized Preoxygenation: Pre-oxygenation with 100% oxygen Induction Type: IV induction Ventilation: Mask ventilation without difficulty Laryngoscope Size: Mac and 4 Grade View: Grade I Tube type: Oral Tube size: 7.0 mm Number of attempts: 1 Placement Confirmation: ETT inserted through vocal cords under direct vision, positive ETCO2 and breath sounds checked- equal and bilateral Secured at: 21 cm Tube secured with: Tape Dental Injury: Teeth and Oropharynx as per pre-operative assessment

## 2023-06-28 NOTE — Anesthesia Postprocedure Evaluation (Signed)
Anesthesia Post Note  Patient: Haley [name]  Procedure(s) Performed: TREPHINATION OF PILONIDAL CYST (Back)     Patient location during evaluation: PACU Anesthesia Type: General Level of consciousness: sedated and patient cooperative Pain management: pain level controlled Vital Signs Assessment: post-procedure vital signs reviewed and stable Respiratory status: spontaneous breathing Cardiovascular status: stable Anesthetic complications: no   No notable events documented.  Last Vitals:  Vitals:   06/27/23 1609 06/27/23 1635  BP: 103/65 119/76  Pulse: 84 75  Resp: 18 17  Temp:  36.9 C  SpO2:  95%    Last Pain:  Vitals:   06/27/23 1544  TempSrc:   PainSc: 0-No pain                 Lewie Loron

## 2023-06-29 ENCOUNTER — Encounter (HOSPITAL_BASED_OUTPATIENT_CLINIC_OR_DEPARTMENT_OTHER): Payer: Self-pay | Admitting: Surgery

## 2023-08-03 ENCOUNTER — Other Ambulatory Visit: Payer: Self-pay

## 2023-08-03 ENCOUNTER — Ambulatory Visit (HOSPITAL_COMMUNITY)
Admission: EM | Admit: 2023-08-03 | Discharge: 2023-08-03 | Disposition: A | Discharge status: Home / Self Care | Payer: MEDICAID | Attending: Family Medicine | Admitting: Family Medicine

## 2023-08-03 ENCOUNTER — Encounter (HOSPITAL_COMMUNITY): Payer: Self-pay | Admitting: Emergency Medicine

## 2023-08-03 DIAGNOSIS — J029 Acute pharyngitis, unspecified: Secondary | ICD-10-CM | POA: Diagnosis not present

## 2023-08-03 LAB — POCT RAPID STREP A (OFFICE): Rapid Strep A Screen: NEGATIVE

## 2023-08-03 MED ORDER — LIDOCAINE VISCOUS HCL 2 % MT SOLN: 5.0000 mL | Freq: Four times a day (QID) | OROMUCOSAL | 0 refills | Status: DC | PRN

## 2023-08-03 NOTE — ED Provider Notes (Signed)
[facility]   [id number] 08/03/23 Arrival Time: 1225  ASSESSMENT & PLAN:  1. Sore throat    Discussed typical duration of likely viral illness. Rapid strep negative; throat culture pending.    Discharge Instructions      You may use over the counter ibuprofen or acetaminophen as needed.  For a sore throat, over the counter products such as Colgate Peroxyl Mouth Sore Rinse or Chloraseptic Sore Throat Spray may provide some temporary relief. Your rapid strep test was negative today. We have sent your throat swab for culture and will let you know of any positive results.      OTC symptom care as needed.  Discharge Medication List as of 08/03/2023  2:19 PM     START taking these medications   Details  magic mouthwash (lidocaine, diphenhydrAMINE, alum & mag hydroxide) suspension Swish and spit 5 mLs 4 (four) times daily as needed for mouth pain., Starting Wed 08/03/2023, Normal         Follow-up Information     [facility].   Specialty: Urgent Care Why: If worsening or failing to improve as anticipated. Contact information: [address] [place] [address] [phone]                Reviewed expectations re: course of current medical issues. Questions answered. Outlined signs and symptoms indicating need for more acute intervention. Understanding verbalized. After Visit Summary given.   SUBJECTIVE: History from: Patient. Haley [name] is a 22 y.o. female. Sore throat for 2 days.  Reports painful to swallow. Throat feels very swollen and sore to the touch on both sides of neck.  Denies fever.  Mild cough. Normal PO intake without n/v/d.  OBJECTIVE:  Vitals:   08/03/23 1310  BP: 106/71  Pulse: (!) 102  Resp: 18  Temp: 98.5 F (36.9 C)  TempSrc: Oral  SpO2: 98%    Recheck P: 92 General appearance: alert; no distress Eyes: PERRLA; EOMI; conjunctiva normal HENT: [place]; AT; with mild nasal  congestion; throat with erythema/cobblestoning Neck: supple with small cervical LAD Lungs: speaks full sentences without difficulty; unlabored Extremities: no edema Skin: warm and dry Neurologic: normal gait Psychological: alert and cooperative; normal mood and affect  Labs: Results for orders placed or performed during the hospital encounter of 08/03/23  POC rapid strep A  Result Value Ref Range   Rapid Strep A Screen Negative Negative   Labs Reviewed  CULTURE, GROUP A STREP [facility])  POCT RAPID STREP A (OFFICE)     No Known Allergies  Past Medical History:  Diagnosis Date   Anemia    History of chlamydia 08/16/2016   Azithromycin Rx given 11/13   Pilonidal cyst    w/ hx recurrent abscess's   Sickle cell trait (HCC)    Social History   Socioeconomic History   Marital status: Single    Spouse name: Not on file   Number of children: Not on file   Years of education: Not on file   Highest education level: Not on file  Occupational History   Not on file  Tobacco Use   Smoking status: Every Day    Types: Cigars   Smokeless tobacco: Never   Tobacco comments:    06-16-2023 black -n-mild cigars ,  smokes per 4 day, smoked since age 71  Vaping Use   Vaping status: Never Used  Substance and Sexual Activity   Alcohol use: No   Drug use: Never  Sexual activity: Yes    Birth control/protection: I.U.D.    Comment: mirena  placed  on 12-26-2020  Other Topics Concern   Not on file  Social History Narrative   Not on file   Social Determinants of Health   Financial Resource Strain: Not at Risk (06/16/2023)   Received from [employer]    Financial Resource Strain: 1  Food Insecurity: Not at Risk (06/16/2023)   Received from [employer]    Food: 1  Transportation Needs: Not at Risk (06/16/2023)   Received from [employer] Needs    Transportation: 1  Physical Activity: Not on File (05/30/2023)   Received from [facility]    Physical Activity    Physical Activity: 0  Stress: Not on File (05/30/2023)   Received from [facility]   Stress    Stress: 0  Social Connections: Not on File (06/16/2023)   Received from [employer]   Social Connections    Connectedness: 0  Intimate Partner Violence: Not on file   Family History  Problem Relation Age of Onset   Healthy Mother    Past Surgical History:  Procedure Laterality Date   CYST EXCISION N/A 06/27/2023   Procedure: TREPHINATION OF PILONIDAL CYST;  Surgeon: Berna Bue, MD;  Location: [facility] [place];  Service: General;  Laterality: N/A;   EXCISION MASS NECK  2010   per pt benign   PILONIDAL CYST EXCISION N/A 06/03/2021   Procedure: TREPHINATION  OF PILONIDAL CYST;  Surgeon: Berna Bue, MD;  Location: [facility];  Service: General;  LateralityVertis Kelch, MD 08/03/23 1607

## 2023-08-03 NOTE — Discharge Instructions (Signed)
You may use over the counter ibuprofen or acetaminophen as needed.  For a sore throat, over the counter products such as Colgate Peroxyl Mouth Sore Rinse or Chloraseptic Sore Throat Spray may provide some temporary relief. Your rapid strep test was negative today. We have sent your throat swab for culture and will let you know of any positive results. 

## 2023-08-03 NOTE — ED Triage Notes (Signed)
Sore throat for 2 days.  Reports painful to swallow. Throat feels very swollen and sore to the touch on both sides of neck.  Denies fever.

## 2023-08-04 ENCOUNTER — Telehealth: Payer: Self-pay

## 2023-08-04 LAB — CULTURE, GROUP A STREP (THRC)

## 2023-08-04 MED ORDER — IBUPROFEN 600 MG PO TABS: 600.0000 mg | ORAL_TABLET | Freq: Three times a day (TID) | ORAL | 0 refills | Status: AC | PRN

## 2023-08-04 MED ORDER — AMOXICILLIN 500 MG PO CAPS: 500.0000 mg | ORAL_CAPSULE | Freq: Two times a day (BID) | ORAL | 0 refills | Status: AC

## 2023-08-04 MED ORDER — LIDOCAINE VISCOUS HCL 2 % MT SOLN: 5.0000 mL | Freq: Four times a day (QID) | OROMUCOSAL | 0 refills | Status: AC | PRN

## 2023-08-04 NOTE — Telephone Encounter (Signed)
Per protocol pt requires tx with Amoxicillin. TC to pt to advise of results and meds. Verbalized understanding. Pt requested magic mouthwash from yesterday be sent a different pharmacy. Pt also requested script for ibuprofen. VO for 600mg  ibuprofen q8h PRN, quantity 10 per Helaine Chess,  PA-C.

## 2023-12-06 ENCOUNTER — Encounter (INDEPENDENT_AMBULATORY_CARE_PROVIDER_SITE_OTHER): Payer: Self-pay

## 2024-01-24 ENCOUNTER — Encounter (HOSPITAL_COMMUNITY): Payer: Self-pay | Admitting: *Deleted

## 2024-01-24 ENCOUNTER — Ambulatory Visit (HOSPITAL_COMMUNITY)
Admission: EM | Admit: 2024-01-24 | Discharge: 2024-01-24 | Disposition: A | Discharge status: Home / Self Care | Payer: MEDICAID | Attending: Internal Medicine | Admitting: Internal Medicine

## 2024-01-24 ENCOUNTER — Telehealth (HOSPITAL_COMMUNITY): Payer: Self-pay | Admitting: Emergency Medicine

## 2024-01-24 DIAGNOSIS — R112 Nausea with vomiting, unspecified: Secondary | ICD-10-CM

## 2024-01-24 DIAGNOSIS — R197 Diarrhea, unspecified: Secondary | ICD-10-CM | POA: Diagnosis not present

## 2024-01-24 DIAGNOSIS — J069 Acute upper respiratory infection, unspecified: Secondary | ICD-10-CM

## 2024-01-24 MED ORDER — ONDANSETRON 4 MG PO TBDP
ORAL_TABLET | ORAL | Status: AC
  Filled 2024-01-24: qty 1

## 2024-01-24 MED ORDER — PROMETHAZINE-DM 6.25-15 MG/5ML PO SYRP: 5.0000 mL | ORAL_SOLUTION | Freq: Every evening | ORAL | 0 refills | Status: AC | PRN

## 2024-01-24 MED ORDER — PROMETHAZINE-DM 6.25-15 MG/5ML PO SYRP: 5.0000 mL | ORAL_SOLUTION | Freq: Every evening | ORAL | 0 refills | Status: DC | PRN

## 2024-01-24 MED ORDER — ONDANSETRON 4 MG PO TBDP
4.0000 mg | ORAL_TABLET | Freq: Once | ORAL | Status: AC
  Administered 2024-01-24: 4 mg via ORAL

## 2024-01-24 NOTE — ED Provider Notes (Signed)
 [facility]    CSN: [id number] Arrival date & time: 01/24/24  1122      History   Chief Complaint Chief Complaint  Patient presents with   Cough   Emesis   Fatigue   Headache   Diarrhea    HPI Haley [name] is a 23 y.o. female.   Haley [name] is a 23 y.o. female presenting for chief complaint of Cough, Emesis, Fatigue, Headache, and Diarrhea that started 7 days ago. Her symptoms started with cough and headache, nausea/vomiting/diarrhea started a few days later.  She has had a few episodes of nausea/nonbilious and nonbloody emesis over the last 24 hours.  Cough is mostly dry and nonproductive.  Denies shortness of breath and chest pain associated with coughing.  Denies abdominal pain, dizziness, rash, fever, chills, body aches, and recent antibiotic or steroid use.  She smokes Black and mild cigars, denies other tobacco use and drug use.  Denies history of chronic respiratory problems.  Denies chance of pregnancy, she has an IUD.  She is taking over-the-counter medications with some relief.  Her family member is sick with similar symptoms.   Cough Associated symptoms: headaches   Emesis Associated symptoms: cough, diarrhea and headaches   Headache Associated symptoms: cough, diarrhea and vomiting   Diarrhea Associated symptoms: headaches and vomiting     Past Medical History:  Diagnosis Date   Anemia    History of chlamydia 08/16/2016   Azithromycin  Rx given 11/13   Pilonidal cyst    w/ hx recurrent abscess's   Sickle cell trait [facility])     Patient Active Problem List   Diagnosis Date Noted   Encounter for counseling regarding contraception 12/10/2020   Sickle cell trait (HCC) 06/25/2016    Past Surgical History:  Procedure Laterality Date   CYST EXCISION N/A 06/27/2023   Procedure: TREPHINATION OF PILONIDAL CYST;  Surgeon: Adalberto Acton, MD;  Location: [facility] [place];  Service: General;  Laterality: N/A;   EXCISION MASS NECK  2010    per pt benign   PILONIDAL CYST EXCISION N/A 06/03/2021   Procedure: TREPHINATION  OF PILONIDAL CYST;  Surgeon: Adalberto Acton, MD;  Location: [facility];  Service: General;  Laterality: N/A;    OB History     Gravida  1   Para  1   Term  1   Preterm      AB      Living  1      SAB      IAB      Ectopic      Multiple  0   Live Births  1            Home Medications    Prior to Admission medications   Medication Sig Start Date End Date Taking? Authorizing Provider  promethazine -dextromethorphan (PROMETHAZINE -DM) 6.25-15 MG/5ML syrup Take 5 mLs by mouth at bedtime as needed for cough. 01/24/24  Yes Starlene Eaton, FNP  cholecalciferol (VITAMIN D3) 25 MCG (1000 UNIT) tablet Take 1,000 Units by mouth daily.    [provider]  cyclobenzaprine  (FLEXERIL ) 10 MG tablet Take 1 tablet (10 mg total) by mouth 2 (two) times daily as needed for muscle spasms. Patient not taking: Reported on 08/03/2023 06/23/23   Debbra Fairy, PA-C  ibuprofen  (ADVIL ) 600 MG tablet Take 1 tablet (600 mg total) by mouth every 6 (six) hours as needed. 06/23/23   Debbra Fairy, PA-C  ibuprofen  (ADVIL ) 600 MG tablet Take 1  tablet (600 mg total) by mouth every 8 (eight) hours as needed. 08/04/23   Eloise Hake Scales, PA-C  levonorgestrel  (MIRENA ) 20 MCG/DAY IUD 1 each by Intrauterine route once.    [provider]  magic mouthwash (lidocaine , diphenhydrAMINE , alum & mag hydroxide) suspension Swish and spit 5 mLs 4 (four) times daily as needed for mouth pain. 08/04/23   Eloise Hake Scales, PA-C  metroNIDAZOLE  (FLAGYL ) 500 MG tablet Take 500 mg by mouth 3 (three) times daily. Patient not taking: Reported on 08/03/2023    [provider]    Family History Family History  Problem Relation Age of Onset   Healthy Mother     Social History Social History   Tobacco Use   Smoking status: Every Day    Types: Cigars   Smokeless tobacco: Never    Tobacco comments:    06-16-2023 black -n-mild cigars ,  smokes per 4 day, smoked since age 89  Vaping Use   Vaping status: Never Used  Substance Use Topics   Alcohol use: No   Drug use: Never     Allergies   Patient has no known allergies.   Review of Systems Review of Systems  Respiratory:  Positive for cough.   Gastrointestinal:  Positive for diarrhea and vomiting.  Neurological:  Positive for headaches.  Per HPI   Physical Exam Triage Vital Signs ED Triage Vitals  Encounter Vitals Group     BP 01/24/24 1218 105/67     Systolic BP Percentile --      Diastolic BP Percentile --      Pulse Rate 01/24/24 1218 98     Resp 01/24/24 1218 20     Temp 01/24/24 1218 98.9 F (37.2 C)     Temp Source 01/24/24 1218 Oral     SpO2 01/24/24 1218 96 %     Weight --      Height --      Head Circumference --      Peak Flow --      Pain Score 01/24/24 1217 5     Pain Loc --      Pain Education --      Exclude from Growth Chart --    No data found.  Updated Vital Signs BP 105/67 (BP Location: Right Arm)   Pulse 98   Temp 98.9 F (37.2 C) (Oral)   Resp 20   SpO2 96%   Visual Acuity Right Eye Distance:   Left Eye Distance:   Bilateral Distance:    Right Eye Near:   Left Eye Near:    Bilateral Near:     Physical Exam Vitals and nursing note reviewed.  Constitutional:      Appearance: She is not ill-appearing or toxic-appearing.  HENT:     Head: Normocephalic and atraumatic.     Right Ear: Hearing, tympanic membrane, ear canal and external ear normal.     Left Ear: Hearing, tympanic membrane, ear canal and external ear normal.     Nose: Congestion present.     Mouth/Throat:     Lips: Pink.     Mouth: Mucous membranes are moist. No injury or oral lesions.     Dentition: Normal dentition.     Tongue: No lesions.     Pharynx: Oropharynx is clear. Uvula midline. No pharyngeal swelling, oropharyngeal exudate, posterior oropharyngeal erythema, uvula swelling or  postnasal drip.     Tonsils: No tonsillar exudate.  Eyes:     General: Lids are normal.  Vision grossly intact. Gaze aligned appropriately.     Extraocular Movements: Extraocular movements intact.     Conjunctiva/sclera: Conjunctivae normal.  Neck:     Trachea: Trachea and phonation normal.  Cardiovascular:     Rate and Rhythm: Normal rate and regular rhythm.     Heart sounds: Normal heart sounds, S1 normal and S2 normal.  Pulmonary:     Effort: Pulmonary effort is normal. No respiratory distress.     Breath sounds: Normal air entry. No wheezing, rhonchi or rales.     Comments: Speaking in full sentences without difficulty. Chest:     Chest wall: No tenderness.  Musculoskeletal:     Cervical back: Neck supple.  Lymphadenopathy:     Cervical: No cervical adenopathy.  Skin:    General: Skin is warm and dry.     Capillary Refill: Capillary refill takes less than 2 seconds.     Findings: No rash.  Neurological:     General: No focal deficit present.     Mental Status: She is alert and oriented to person, place, and time. Mental status is at baseline.     Cranial Nerves: No dysarthria or facial asymmetry.  Psychiatric:        Mood and Affect: Mood normal.        Speech: Speech normal.        Behavior: Behavior normal.        Thought Content: Thought content normal.        Judgment: Judgment normal.      UC Treatments / Results  Labs (all labs ordered are listed, but only abnormal results are displayed) Labs Reviewed - No data to display  EKG   Radiology No results found.  Procedures Procedures (including critical care time)  Medications Ordered in UC Medications  ondansetron  (ZOFRAN -ODT) disintegrating tablet 4 mg (4 mg Oral Given 01/24/24 1221)    Initial Impression / Assessment and Plan / UC Course  I have reviewed the triage vital signs and the nursing notes.  Pertinent labs & imaging results that were available during my care of the patient were reviewed by  me and considered in my medical decision making (see chart for details).   1.  Viral URI with cough, nausea vomiting diarrhea Suspect viral URI, viral syndrome.  Strep/viral testing: Deferred viral testing given timing of illness.  Suspect patient is suffering from 2 different viral illnesses.  She got better after a few days of cough and headache, then developed nausea vomiting and diarrhea.  High clinical suspicion for norovirus. We will treat symptoms with Zofran  as needed for nausea and vomiting. She does not appear to be clinically dehydrated and is tolerating fluids without vomiting in clinic. Bland foods recommended.  May increase diet as tolerated.  Physical exam findings reassuring, vital signs hemodynamically stable, and lungs clear, therefore deferred imaging of the chest.  Advised supportive care/prescriptions for symptomatic relief as outlined in AVS.    Counseled patient on potential for adverse effects with medications prescribed/recommended today, strict ER and return-to-clinic precautions discussed, patient verbalized understanding.    Final Clinical Impressions(s) / UC Diagnoses   Final diagnoses:  Viral URI with cough  Nausea vomiting and diarrhea     Discharge Instructions      You have a viral illness which will improve on its own with rest, fluids, and medications to help with your symptoms.  Tylenol , guaifenesin (plain mucinex), and saline nasal sprays may help relieve symptoms.  Zofran  4 mg every 8  hours as needed for nausea or vomiting.  Two teaspoons of honey in 1 cup of warm water  every 4-6 hours may help with throat pains.  Humidifier in room at nighttime may help soothe cough (clean well daily).   For chest pain, shortness of breath, inability to keep food or fluids down without vomiting, fever that does not respond to tylenol  or motrin , or any other severe symptoms, please go to the ER for further evaluation. Return to urgent care as needed,  otherwise follow-up with PCP.      ED Prescriptions     Medication Sig Dispense Auth. Provider   promethazine -dextromethorphan (PROMETHAZINE -DM) 6.25-15 MG/5ML syrup Take 5 mLs by mouth at bedtime as needed for cough. 118 mL Starlene Eaton, FNP      PDMP not reviewed this encounter.   Starlene Eaton, [place] 01/24/24 1342

## 2024-01-24 NOTE — ED Triage Notes (Signed)
 Pt states she has cough, vomiting, diarrhea, headache, fatigue X 8 days. She hasn't been taking any meds.

## 2024-01-24 NOTE — Discharge Instructions (Signed)
 You have a viral illness which will improve on its own with rest, fluids, and medications to help with your symptoms.  Tylenol , guaifenesin (plain mucinex), and saline nasal sprays may help relieve symptoms.  Zofran  4 mg every 8 hours as needed for nausea or vomiting.  Two teaspoons of honey in 1 cup of warm water  every 4-6 hours may help with throat pains.  Humidifier in room at nighttime may help soothe cough (clean well daily).   For chest pain, shortness of breath, inability to keep food or fluids down without vomiting, fever that does not respond to tylenol  or motrin , or any other severe symptoms, please go to the ER for further evaluation. Return to urgent care as needed, otherwise follow-up with PCP.

## 2024-04-19 ENCOUNTER — Ambulatory Visit: Payer: MEDICAID | Admitting: Obstetrics and Gynecology

## 2024-07-03 ENCOUNTER — Ambulatory Visit: Payer: Self-pay | Admitting: General Surgery

## 2024-08-01 ENCOUNTER — Other Ambulatory Visit: Payer: Self-pay

## 2024-08-01 ENCOUNTER — Encounter (HOSPITAL_COMMUNITY): Payer: Self-pay | Admitting: General Surgery

## 2024-08-01 NOTE — Progress Notes (Signed)
 SDW CALL  Patient was given pre-op instructions over the phone. The opportunity was given for the patient to ask questions. No further questions asked. Patient verbalized understanding of instructions given.   PCP -  [facility] Cardiologist -   PPM/ICD - denies Device Orders - n/a Rep Notified - n/a  Chest x-ray - denies EKG - denies Stress Test - denies ECHO - denies Cardiac Cath - denies  Sleep Study - denies CPAP - n/a  DM -denies  Blood Thinner Instructions:denies Aspirin Instructions:denies  ERAS Protcol -clear liquids until 9:00 am.   COVID TEST- n/a   Anesthesia review: no  Patient denies shortness of breath, fever, cough and chest pain over the phone call   All instructions explained to the patient, with a verbal understanding of the material. Patient agrees to go over the instructions while at home for a better understanding.

## 2024-08-03 ENCOUNTER — Ambulatory Visit (HOSPITAL_COMMUNITY): Admission: RE | Admit: 2024-08-03 | Payer: MEDICAID | Source: Home / Self Care | Admitting: General Surgery

## 2024-08-03 SURGERY — EXCISION, PILONIDAL CYST
Anesthesia: Choice
